# Patient Record
Sex: Male | Born: 1950 | Race: Black or African American | Hispanic: No | Smoking: Former smoker
Health system: Southern US, Community
[De-identification: ages and names within clinical notes are randomized; demographics above are authoritative.]

## PROBLEM LIST (undated history)

## (undated) DIAGNOSIS — E119 Type 2 diabetes mellitus without complications: Secondary | ICD-10-CM

## (undated) DIAGNOSIS — I10 Essential (primary) hypertension: Secondary | ICD-10-CM

## (undated) DIAGNOSIS — G4730 Sleep apnea, unspecified: Secondary | ICD-10-CM

## (undated) HISTORY — PX: HERNIA REPAIR: SHX51

## (undated) HISTORY — DX: Type 2 diabetes mellitus without complications: E11.9

---

## 2003-11-07 ENCOUNTER — Emergency Department: Payer: Self-pay | Admitting: Emergency Medicine

## 2005-08-04 ENCOUNTER — Emergency Department: Payer: Self-pay | Admitting: Emergency Medicine

## 2005-10-14 ENCOUNTER — Ambulatory Visit: Payer: Self-pay

## 2005-12-03 ENCOUNTER — Ambulatory Visit: Payer: Self-pay | Admitting: General Surgery

## 2007-02-15 ENCOUNTER — Encounter: Admission: RE | Admit: 2007-02-15 | Discharge: 2007-02-15 | Payer: Self-pay | Admitting: Family Medicine

## 2007-08-04 ENCOUNTER — Ambulatory Visit: Payer: Self-pay | Admitting: Gastroenterology

## 2007-12-12 ENCOUNTER — Emergency Department: Payer: Self-pay | Admitting: Emergency Medicine

## 2008-04-18 ENCOUNTER — Emergency Department: Payer: Self-pay | Admitting: Emergency Medicine

## 2010-02-11 ENCOUNTER — Encounter: Payer: Self-pay | Admitting: Family Medicine

## 2010-02-11 ENCOUNTER — Encounter: Payer: Self-pay | Admitting: Neurosurgery

## 2010-11-14 ENCOUNTER — Ambulatory Visit: Payer: Self-pay | Admitting: Gastroenterology

## 2011-08-06 ENCOUNTER — Emergency Department: Payer: Self-pay | Admitting: *Deleted

## 2011-08-06 LAB — CBC
HCT: 44.7 % (ref 40.0–52.0)
MCHC: 33.1 g/dL (ref 32.0–36.0)
Platelet: 166 10*3/uL (ref 150–440)
RDW: 13.8 % (ref 11.5–14.5)

## 2011-08-06 LAB — BASIC METABOLIC PANEL
Anion Gap: 9 (ref 7–16)
BUN: 23 mg/dL — ABNORMAL HIGH (ref 7–18)
Co2: 30 mmol/L (ref 21–32)
Creatinine: 1.88 mg/dL — ABNORMAL HIGH (ref 0.60–1.30)
EGFR (African American): 43 — ABNORMAL LOW
EGFR (Non-African Amer.): 37 — ABNORMAL LOW

## 2011-08-06 LAB — CK TOTAL AND CKMB (NOT AT ARMC): CK, Total: 568 U/L — ABNORMAL HIGH (ref 35–232)

## 2011-08-07 ENCOUNTER — Emergency Department: Payer: Self-pay | Admitting: Unknown Physician Specialty

## 2011-08-08 LAB — COMPREHENSIVE METABOLIC PANEL
Albumin: 3.9 g/dL (ref 3.4–5.0)
Alkaline Phosphatase: 80 U/L (ref 50–136)
Anion Gap: 8 (ref 7–16)
BUN: 29 mg/dL — ABNORMAL HIGH (ref 7–18)
Bilirubin,Total: 0.3 mg/dL (ref 0.2–1.0)
Co2: 33 mmol/L — ABNORMAL HIGH (ref 21–32)
Creatinine: 1.89 mg/dL — ABNORMAL HIGH (ref 0.60–1.30)
EGFR (African American): 43 — ABNORMAL LOW
EGFR (Non-African Amer.): 37 — ABNORMAL LOW
Glucose: 127 mg/dL — ABNORMAL HIGH (ref 65–99)
SGPT (ALT): 29 U/L
Sodium: 143 mmol/L (ref 136–145)
Total Protein: 8.1 g/dL (ref 6.4–8.2)

## 2011-08-08 LAB — CBC
MCHC: 33 g/dL (ref 32.0–36.0)
Platelet: 163 10*3/uL (ref 150–440)
RDW: 13.6 % (ref 11.5–14.5)
WBC: 5.2 10*3/uL (ref 3.8–10.6)

## 2011-08-25 ENCOUNTER — Emergency Department: Payer: Self-pay | Admitting: *Deleted

## 2011-08-25 LAB — BASIC METABOLIC PANEL
BUN: 16 mg/dL (ref 7–18)
EGFR (African American): >60
EGFR (Non-African Amer.): 55 — ABNORMAL LOW
Glucose: 106 mg/dL — ABNORMAL HIGH (ref 65–99)
Potassium: 3.8 mmol/L (ref 3.5–5.1)
Sodium: 140 mmol/L (ref 136–145)

## 2011-08-25 LAB — CBC
HCT: 38.2 % — ABNORMAL LOW (ref 40.0–52.0)
MCHC: 35.2 g/dL (ref 32.0–36.0)
Platelet: 151 10*3/uL (ref 150–440)
RDW: 13.5 % (ref 11.5–14.5)
WBC: 5.1 10*3/uL (ref 3.8–10.6)

## 2011-08-25 LAB — CK TOTAL AND CKMB (NOT AT ARMC)
CK, Total: 486 U/L — ABNORMAL HIGH (ref 35–232)
CK-MB: 2.8 ng/mL (ref 0.5–3.6)

## 2012-12-14 DIAGNOSIS — E785 Hyperlipidemia, unspecified: Secondary | ICD-10-CM | POA: Insufficient documentation

## 2013-06-19 ENCOUNTER — Emergency Department: Payer: Self-pay | Admitting: Emergency Medicine

## 2013-06-26 ENCOUNTER — Emergency Department (HOSPITAL_COMMUNITY)
Admission: EM | Admit: 2013-06-26 | Discharge: 2013-06-26 | Disposition: A | Discharge status: Home / Self Care | Payer: Medicare Other | Attending: Emergency Medicine | Admitting: Emergency Medicine

## 2013-06-26 ENCOUNTER — Emergency Department (HOSPITAL_COMMUNITY): Payer: Medicare Other

## 2013-06-26 ENCOUNTER — Encounter (HOSPITAL_COMMUNITY): Payer: Self-pay | Admitting: Emergency Medicine

## 2013-06-26 DIAGNOSIS — IMO0002 Reserved for concepts with insufficient information to code with codable children: Secondary | ICD-10-CM | POA: Insufficient documentation

## 2013-06-26 DIAGNOSIS — Z79899 Other long term (current) drug therapy: Secondary | ICD-10-CM | POA: Insufficient documentation

## 2013-06-26 DIAGNOSIS — S52009A Unspecified fracture of upper end of unspecified ulna, initial encounter for closed fracture: Secondary | ICD-10-CM

## 2013-06-26 DIAGNOSIS — I10 Essential (primary) hypertension: Secondary | ICD-10-CM | POA: Insufficient documentation

## 2013-06-26 DIAGNOSIS — Z5189 Encounter for other specified aftercare: Secondary | ICD-10-CM

## 2013-06-26 DIAGNOSIS — Y9289 Other specified places as the place of occurrence of the external cause: Secondary | ICD-10-CM | POA: Insufficient documentation

## 2013-06-26 DIAGNOSIS — R296 Repeated falls: Secondary | ICD-10-CM | POA: Insufficient documentation

## 2013-06-26 DIAGNOSIS — Y9302 Activity, running: Secondary | ICD-10-CM | POA: Insufficient documentation

## 2013-06-26 DIAGNOSIS — Z4802 Encounter for removal of sutures: Secondary | ICD-10-CM | POA: Insufficient documentation

## 2013-06-26 DIAGNOSIS — S52109A Unspecified fracture of upper end of unspecified radius, initial encounter for closed fracture: Principal | ICD-10-CM

## 2013-06-26 HISTORY — DX: Sleep apnea, unspecified: G47.30

## 2013-06-26 HISTORY — DX: Essential (primary) hypertension: I10

## 2013-06-26 MED ORDER — CEPHALEXIN 500 MG PO CAPS: ORAL_CAPSULE | ORAL | Status: DC

## 2013-06-26 MED ORDER — SULFAMETHOXAZOLE-TRIMETHOPRIM 800-160 MG PO TABS: 1.0000 | ORAL_TABLET | Freq: Two times a day (BID) | ORAL | Status: DC

## 2013-06-26 MED ORDER — OXYCODONE-ACETAMINOPHEN 5-325 MG PO TABS
1.0000 | ORAL_TABLET | Freq: Once | ORAL | Status: AC
  Administered 2013-06-26: 1 via ORAL
  Filled 2013-06-26: qty 1

## 2013-06-26 MED ORDER — OXYCODONE-ACETAMINOPHEN 5-325 MG PO TABS: 1.0000 | ORAL_TABLET | Freq: Four times a day (QID) | ORAL | Status: DC | PRN

## 2013-06-26 MED ORDER — OXYCODONE-ACETAMINOPHEN 5-325 MG PO TABS
2.0000 | ORAL_TABLET | Freq: Once | ORAL | Status: AC
  Administered 2013-06-26: 2 via ORAL
  Filled 2013-06-26: qty 2

## 2013-06-26 MED ORDER — PROMETHAZINE HCL 25 MG PO TABS: 25.0000 mg | ORAL_TABLET | Freq: Four times a day (QID) | ORAL | Status: DC | PRN

## 2013-06-26 NOTE — ED Provider Notes (Signed)
CSN: [id number]     Arrival date & time 06/26/13  1328 History   First MD Initiated Contact with Patient 06/26/13 1522     Chief Complaint  Patient presents with  . Fall     (Consider location/radiation/quality/duration/timing/severity/associated sxs/prior Treatment) HPI Comments: The patient is a right hand dominant 63 year old male with history of hypertension and sleep apnea who presents today after a mechanical fall. He reports that he was at [facility] and left his hat on the bumper cart. He was running to get it when he stepped in a hole. He fell on his outstretched left hand and then turned and fell onto his right elbow. He also hit his right knee. He is only having pain currently in his right elbow. It is worse with movement he palpation. During the fall he did not lose consciousness or hit his head. He is not on any blood thinners. He received one Percocet in triage but seems to have improved his pain. Patient also is concerned about an abscess that he had lanced one week ago. This was done at [facility]. He was supposed to have a wound check in 2 days, but did not return. He's been taking Keflex as prescribed. No fevers, chills, nausea, vomiting or abdominal pain. He does have pain with bowel movements. His last bowel movement was yesterday. TDAP up to date within 5 years.   The history is provided by the patient. No language interpreter was used.    Past Medical History  Diagnosis Date  . Hypertension   . Sleep apnea    Past Surgical History  Procedure Laterality Date  . Hernia repair     History reviewed. No pertinent family history. History  Substance Use Topics  . Smoking status: Never Smoker   . Smokeless tobacco: Not on file  . Alcohol Use: No    Review of Systems  Constitutional: Negative for fever and chills.  Respiratory: Negative for shortness of breath.   Cardiovascular: Negative for chest pain.  Gastrointestinal: Negative for  nausea, vomiting and abdominal pain.  Musculoskeletal: Positive for arthralgias, joint swelling and myalgias.  Skin: Positive for wound.  Neurological: Negative for headaches.  All other systems reviewed and are negative.     Allergies  Review of patient's allergies indicates no known allergies.  Home Medications   Prior to Admission medications   Medication Sig Start Date End Date Taking? Authorizing Provider  diltiazem (TIAZAC) 360 MG 24 hr capsule Take 360 mg by mouth daily. 06/24/13  Yes Historical Provider, MD  omeprazole (PRILOSEC) 20 MG capsule Take 20 mg by mouth daily. 06/21/13  Yes Historical Provider, MD  quinapril-hydrochlorothiazide (ACCURETIC) 20-12.5 MG per tablet Take 1 tablet by mouth daily. 06/10/13  Yes Historical Provider, MD  sulfamethoxazole-trimethoprim (BACTRIM DS) 800-160 MG per tablet Take 1 tablet by mouth 2 (two) times daily.  06/19/13  Yes Historical Provider, MD  tamsulosin (FLOMAX) 0.4 MG CAPS capsule Take 0.4 mg by mouth daily. 06/10/13  Yes Historical Provider, MD   BP 155/90  Pulse 67  Temp(Src) 98 F (36.7 C) (Oral)  Resp 20  Ht 5\' 7"  (1.702 m)  Wt 220 lb (99.791 kg)  BMI 34.45 kg/m2  SpO2 100% Physical Exam  Nursing note and vitals reviewed. Constitutional: He is oriented to person, place, and time. He appears well-developed and well-nourished. No distress.  HENT:  Head: Normocephalic and atraumatic.  Right Ear: External ear normal.  Left Ear: External ear normal.  Nose: Nose normal.  Eyes: Conjunctivae and EOM are normal. Pupils are equal, round, and reactive to light.  Neck: Normal range of motion. No spinous process tenderness and no muscular tenderness present. No tracheal deviation present.  Cardiovascular: Normal rate, regular rhythm, normal heart sounds, intact distal pulses and normal pulses.   Pulses:      Radial pulses are 2+ on the right side, and 2+ on the left side.       Posterior tibial pulses are 2+ on the right side, and 2+ on  the left side.  Pulmonary/Chest: Effort normal and breath sounds normal. No stridor.  Abdominal: Soft. He exhibits no distension. There is no tenderness.  Genitourinary:  Well healing area on right buttock. No induration or fluctuance. No erythema. No rectal involvement.   Musculoskeletal: Normal range of motion.  Tenderness to palpation over right elbow. ROM decreased. Joint effusion to right elbow. Compartment soft.   Neurological: He is alert and oriented to person, place, and time.  Sensation intact over entire right arm.   Skin: Skin is warm and dry. Abrasion noted. He is not diaphoretic.  Patient with abrasion to right knee.   Psychiatric: He has a normal mood and affect. His behavior is normal.    ED Course  Procedures (including critical care time) Labs Review Labs Reviewed - No data to display  Imaging Review Dg Elbow Complete Right  06/26/2013   CLINICAL DATA:  Pain post trauma  EXAM: RIGHT ELBOW - COMPLETE 3+ VIEW  COMPARISON:  None.  FINDINGS: Frontal, lateral, and bilateral oblique views were obtained. There is a fracture of the proximal radial metaphysis in near anatomic alignment. There is also an avulsion arising from the coronoid process of the proximal ulna. There is no dislocation. There is a joint effusion. There is no appreciable joint space narrowing.  IMPRESSION: Fracture proximal radial metaphysis. Comminuted avulsion type fracture along the choroid process of the proximal ulna. Joint effusion present. No dislocation.   Electronically Signed   By: Bretta Bang M.D.   On: 06/26/2013 15:29   Dg Forearm Right  06/26/2013   CLINICAL DATA:  Fall, right elbow pain  EXAM: RIGHT FOREARM - 2 VIEW  COMPARISON:  Concurrently obtained radiographs of the right humerus, elbow and hand  FINDINGS: Acute fracture through the neck of the radius. Additionally, there appears to be an acute fracture of the coronoid process of the ulna. There is associated soft tissue swelling and an  elbow joint effusion. The proximal and distal radioulnar joints remain congruent. The visualized portion of the wrists is unremarkable.  IMPRESSION: 1. Acute fracture through the neck of the radius. 2. Probable acute fracture through the coronoid process of the ulna. 3. Elbow joint hemarthrosis.   Electronically Signed   By: Malachy Moan M.D.   On: 06/26/2013 15:31   Dg Humerus Right  06/26/2013   CLINICAL DATA:  Pain post trauma  EXAM: RIGHT HUMERUS - 2+ VIEW  COMPARISON:  None.  FINDINGS: Frontal and lateral views were obtained. There is a fracture of the proximal radial metaphysis. There is also an avulsion arising from the proximal ulna. The humerus appears intact without fracture or dislocation. Joint spaces appear grossly intact.  IMPRESSION: Fractures of the proximal radius and ulna. No humerus fracture. No dislocation seen.   Electronically Signed   By: Bretta Bang M.D.   On: 06/26/2013 15:27   Dg Hand Complete Right  06/26/2013   CLINICAL DATA:  Fall.  Right hand injury and pain.  EXAM:  RIGHT HAND - COMPLETE 3+ VIEW  COMPARISON:  None.  FINDINGS: There is no evidence of fracture or dislocation. 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 No other focal bone abnormality. Soft tissues are unremarkable.  IMPRESSION: No acute findings.   Electronically Signed   By: Myles RosenthalJohn  Stahl M.D.   On: 06/26/2013 15:31     EKG Interpretation None       EMERGENCY DEPARTMENT US SOFT TISSUE INTERPRETATION "Study: Limited Ultrasound of the noted body part in comments below"  INDICATIONS: Soft tissue infection Multiple views of the body part are obtained with a multi-frequency linear probe  PERFORMED BY:  Myself  IMAGES ARCHIVED?: No  SIDE:Right   BODY PART:Other soft tisse (comment in note)  FINDINGS: No abcess noted  LIMITATIONS:  Body Habitus  INTERPRETATION:  No abcess noted and No cellulitis noted  COMMENT:  Right  buttock     MDM   Final diagnoses:  Radius and ulna proximal end fracture  Wound check, abscess   Patient presents to ED for evaluation of right arm pain after a fall. Patient with proximal radial and ulnar fracture. Discussed case with Dr. Janee Mornhompson of hand surgery who recommends posterior splint and for the patient to call his office on Monday to be seen either Monday or Tuesday. Patient is neurovascularly intact and compartment is soft. Patient also with wound recheck. Abscess on right buttocks just posterior to testicle appears to be healing well. A bedside ultrasound was done which shows there is no collection of fluid. There is no surrounding erythema or signs of infection. Patient will continue to take his abx as prescribed. Discussed reasons to return to ED. Vital signs stable for discharge. Patient / Family / Caregiver informed of clinical course, understand medical decision-making process, and agree with plan.    Mora BellmanHannah S Kaoru Rezendes, PA-C 06/26/13 1931

## 2013-06-26 NOTE — Progress Notes (Signed)
Orthopedic Tech Progress Note Patient Details:  Troy Cooper Apr 13, 1950 [id number]  Ortho Devices Type of Ortho Device: Ace wrap;Long arm splint Ortho Device/Splint Location: rue Ortho Device/Splint Interventions: Application   Tinnie Kunin 06/26/2013, 4:45 PM

## 2013-06-26 NOTE — ED Provider Notes (Signed)
Medical screening examination/treatment/procedure(s) were performed by non-physician practitioner and as supervising physician I was immediately available for consultation/collaboration.   EKG Interpretation None       Ethelda Chick, MD 06/26/13 [phone]

## 2013-06-26 NOTE — ED Notes (Signed)
Ortho paged for splint, enroute.

## 2013-06-26 NOTE — ED Notes (Signed)
Per pt sts he had a fall earlier and fell on right arm. sts hurts right upper arm and elbow area. sts he can barely move it.

## 2013-06-26 NOTE — Discharge Instructions (Signed)
Radial Fracture You have a broken bone (fracture) of the forearm. This is the part of your arm between the elbow and your wrist. Your forearm is made up of two bones. These are the radius and ulna. Your fracture is in the radial shaft. This is the bone in your forearm located on the thumb side. A cast or splint is used to protect and keep your injured bone from moving. The cast or splint will be on generally for about 5 to 6 weeks, with individual variations. HOME CARE INSTRUCTIONS   Keep the injured part elevated while sitting or lying down. Keep the injury above the level of your heart (the center of the chest). This will decrease swelling and pain.  Apply ice to the injury for 15-20 minutes, 03-04 times per day while awake, for 2 days. Put the ice in a plastic bag and place a towel between the bag of ice and your cast or splint.  Move your fingers to avoid stiffness and minimize swelling.  If you have a plaster or fiberglass cast:  Do not try to scratch the skin under the cast using sharp or pointed objects.  Check the skin around the cast every day. You may put lotion on any red or sore areas.  Keep your cast dry and clean.  If you have a plaster splint:  Wear the splint as directed.  You may loosen the elastic around the splint if your fingers become numb, tingle, or turn cold or blue.  Do not put pressure on any part of your cast or splint. It may break. Rest your cast only on a pillow for the first 24 hours until it is fully hardened.  Your cast or splint can be protected during bathing with a plastic bag. Do not lower the cast or splint into water.  Only take over-the-counter or prescription medicines for pain, discomfort, or fever as directed by your caregiver. SEEK IMMEDIATE MEDICAL CARE IF:   Your cast gets damaged or breaks.  You have more severe pain or swelling than you did before getting the cast.  You have severe pain when stretching your fingers.  There is a bad  smell, new stains and/or pus-like (purulent) drainage coming from under the cast.  Your fingers or hand turn pale or blue and become cold or your loose feeling. Document Released: 06/20/2005 Document Revised: 04/01/2011 Document Reviewed: 09/16/2005 [facility] Patient Information 2014 [place], [place].  Ulnar Fracture You have a fracture (broken bone) of the forearm. This is the part of your arm between the elbow and your wrist. Your forearm is made up of two bones. These are the radius and ulna. Your fracture is in the ulna. This is the bone in your forearm located on the little finger side of your forearm. A cast or splint is used to protect and keep your injured bone from moving. The cast or splint will be on generally for about 5 to 6 weeks, with individual variations. HOME CARE INSTRUCTIONS   Keep the injured part elevated while sitting or lying down. Keep the injury above the level of your heart (the center of the chest). This will decrease swelling and pain.  Apply ice to the injury for 15-20 minutes, 03-04 times per day while awake, for 2 days. Put the ice in a plastic bag and place a towel between the bag of ice and your cast or splint.  Move your fingers to avoid stiffness and minimize swelling.  If you have a plaster or  fiberglass cast:  Do not try to scratch the skin under the cast using sharp or pointed objects.  Check the skin around the cast every day. You may put lotion on any red or sore areas.  Keep your cast dry and clean.  If you have a plaster splint:  Wear the splint as directed.  You may loosen the elastic around the splint if your fingers become numb, tingle, or turn cold or blue.  Do not put pressure on any part of your cast or splint. It may break. Rest your cast only on a pillow the first 24 hours until it is fully hardened.  Your cast or splint can be protected during bathing with a plastic bag. Do not lower the cast or splint into water.  Only take  over-the-counter or prescription medicines for pain, discomfort, or fever as directed by your caregiver. SEEK IMMEDIATE MEDICAL CARE IF:   Your cast gets damaged or breaks.  You have more severe pain or swelling than you did before the cast.  You have severe pain when stretching your fingers.  There is a bad smell or new stains and/or purulent (pus like) drainage coming from under the cast. Document Released: 06/20/2005 Document Revised: 04/01/2011 Document Reviewed: 11/22/2006 [facility] Patient Information 2014 [place], [place].

## 2013-08-08 ENCOUNTER — Encounter (HOSPITAL_COMMUNITY): Payer: Self-pay | Admitting: Emergency Medicine

## 2013-08-08 ENCOUNTER — Emergency Department (HOSPITAL_COMMUNITY)
Admission: EM | Admit: 2013-08-08 | Discharge: 2013-08-08 | Disposition: A | Discharge status: Home / Self Care | Payer: Medicare Other | Attending: Emergency Medicine | Admitting: Emergency Medicine

## 2013-08-08 DIAGNOSIS — I10 Essential (primary) hypertension: Secondary | ICD-10-CM | POA: Diagnosis not present

## 2013-08-08 DIAGNOSIS — M436 Torticollis: Secondary | ICD-10-CM | POA: Diagnosis not present

## 2013-08-08 DIAGNOSIS — R51 Headache: Secondary | ICD-10-CM | POA: Diagnosis present

## 2013-08-08 DIAGNOSIS — M62838 Other muscle spasm: Secondary | ICD-10-CM

## 2013-08-08 DIAGNOSIS — Z79899 Other long term (current) drug therapy: Secondary | ICD-10-CM | POA: Insufficient documentation

## 2013-08-08 MED ORDER — DIAZEPAM 5 MG PO TABS: 5.0000 mg | ORAL_TABLET | Freq: Two times a day (BID) | ORAL | Status: DC

## 2013-08-08 MED ORDER — TRAMADOL HCL 50 MG PO TABS: 50.0000 mg | ORAL_TABLET | Freq: Four times a day (QID) | ORAL | Status: DC | PRN

## 2013-08-08 MED ORDER — DIAZEPAM 5 MG PO TABS
5.0000 mg | ORAL_TABLET | Freq: Once | ORAL | Status: AC
  Administered 2013-08-08: 5 mg via ORAL
  Filled 2013-08-08: qty 1

## 2013-08-08 MED ORDER — HYDROCODONE-ACETAMINOPHEN 5-325 MG PO TABS
1.0000 | ORAL_TABLET | Freq: Once | ORAL | Status: AC
  Administered 2013-08-08: 1 via ORAL
  Filled 2013-08-08: qty 1

## 2013-08-08 NOTE — ED Provider Notes (Signed)
CSN: [id number]     Arrival date & time 08/08/13  1803 History  This chart was scribed for Johnnette Gourd, PA, working with Layla Maw Ward, DO found by Elon Spanner, ED Scribe. This patient was seen in room TR06C/TR06C and the patient's care was started at 7:10 PM.    Chief Complaint  Patient presents with  . Headache    The history is provided by the patient. No language interpreter was used.    HPI Comments: Troy Cooper is a 63 y.o. male who presents to the Emergency Department complaining of constant, unchanged left-sided neck pain radiating up to his left ear that has been ongoing for 4 weeks.  The neck pain is currently rated a 10/10 and aggravated by coughing, chewing, and swallowing.  He states that all these actions cause the pain to radiate.  He reports that he has been having difficulty sleeping due to the severity of the pain. The patient also notes he has been experiencing some spasms in his neck.  He reports breaking his right elbow after a mechanical fall on June 6 and has been in an arm brace until recently.  He cannot say definitively that he did not sustain any head trauma during the fall but did not experience head pain immediately post-incident.  His head is currently sore to touch and he reports no relief from the naproxen or Robaxin that he has taken for his pain.  However, he states that his pain was relieved by 2 Percocet that he was given at the time of his elbow injury. He denies tinnitus, changes in hearing, vision changes, or headaches.  He also denies shoulder pain, difficulty moving arms. Denies chest pain or sob.   Past Medical History  Diagnosis Date  . Hypertension   . Sleep apnea    Past Surgical History  Procedure Laterality Date  . Hernia repair     History reviewed. No pertinent family history. History  Substance Use Topics  . Smoking status: Never Smoker   . Smokeless tobacco: Not on file  . Alcohol Use: No    Review of Systems  HENT: Negative  for hearing loss and tinnitus.   Eyes: Negative for visual disturbance.  Musculoskeletal: Positive for arthralgias (Right elbow) and neck pain.  Neurological: Positive for headaches.  All other systems reviewed and are negative.     Allergies  Review of patient's allergies indicates no known allergies.  Home Medications   Prior to Admission medications   Medication Sig Start Date End Date Taking? Authorizing Provider  diltiazem (TIAZAC) 360 MG 24 hr capsule Take 360 mg by mouth daily. 06/24/13  Yes Historical Provider, MD  methocarbamol (ROBAXIN) 750 MG tablet Take 750 mg by mouth 4 (four) times daily.   Yes Historical Provider, MD  naproxen sodium (ANAPROX) 550 MG tablet Take 550 mg by mouth 2 (two) times daily as needed. For pain 08/03/13  Yes Historical Provider, MD  omeprazole (PRILOSEC) 20 MG capsule Take 20 mg by mouth daily. 06/21/13  Yes Historical Provider, MD  quinapril-hydrochlorothiazide (ACCURETIC) 20-12.5 MG per tablet Take 1 tablet by mouth daily. 06/10/13  Yes Historical Provider, MD  tamsulosin (FLOMAX) 0.4 MG CAPS capsule Take 0.4 mg by mouth daily. 06/10/13  Yes Historical Provider, MD  diazepam (VALIUM) 5 MG tablet Take 1 tablet (5 mg total) by mouth 2 (two) times daily. 08/08/13   Trevor Mace, PA-C  traMADol (ULTRAM) 50 MG tablet Take 1 tablet (50 mg total) by mouth every  6 (six) hours as needed. 08/08/13   Trevor Maceobyn M Albert, PA-C   BP 144/81  Pulse 76  Temp(Src) 98.4 F (36.9 C) (Oral)  Resp 16  Ht 5\' 7"  (1.702 m)  Wt 220 lb (99.791 kg)  BMI 34.45 kg/m2  SpO2 98% Physical Exam  Nursing note and vitals reviewed. Constitutional: He is oriented to person, place, and time. He appears well-developed and well-nourished. No distress.  HENT:  Head: Normocephalic and atraumatic.  Right Ear: Tympanic membrane normal.  Left Ear: Tympanic membrane normal.  .   Eyes: Conjunctivae and EOM are normal. Pupils are equal, round, and reactive to light.  Neck: Normal range of  motion. Neck supple.  Tender to palpation over left cervical paraspinal muscles.  Worse at base of occiput with spasm.  FROM, pain with flexion.   Cardiovascular: Normal rate, regular rhythm and normal heart sounds.   Pulmonary/Chest: Effort normal and breath sounds normal. No respiratory distress. He has no wheezes. He has no rales.  Musculoskeletal: Normal range of motion. He exhibits no edema.  Neurological: He is alert and oriented to person, place, and time.  5/5 strength in upper extremities.  Sensation intact.     Skin: Skin is warm and dry.  No noticeable bruising or laceration.   Psychiatric: He has a normal mood and affect. His behavior is normal.    ED Course  Procedures (including critical care time)  DIAGNOSTIC STUDIES: Oxygen Saturation is 98% on RA, normal by my interpretation.    COORDINATION OF CARE:  7:18 PM Discussed plans to order pain medication and prescribe tramadol and Valium.  Patient acknowledges and agrees with plan.    Labs Review Labs Reviewed - No data to display  Imaging Review No results found.   EKG Interpretation None      MDM   Final diagnoses:  Torticollis  Muscle spasms of neck   Patient well-appearing in no apparent distress. Afebrile, vital signs. No red flags concerning patient's back pain. Afebrile, no midline tenderness, no focal neurologic deficits. Tenderness and spasm as stated in physical exam. No associated chest pain or shortness of breath. Will change muscle relaxer to Valium from Robaxin and prescribe short course of pain medication. Advised him to apply a heating pad. Followup with PCP. Stable for discharge. Return precautions given. Patient states understanding of treatment care plan and is agreeable.    I personally performed the services described in this documentation, which was scribed in my presence. The recorded information has been reviewed and is accurate.    Trevor MaceRobyn M Albert, PA-C 08/08/13 2344

## 2013-08-08 NOTE — ED Provider Notes (Signed)
Medical screening examination/treatment/procedure(s) were performed by non-physician practitioner and as supervising physician I was immediately available for consultation/collaboration.   EKG Interpretation None        Kristen N Ward, DO 08/08/13 2345 

## 2013-08-08 NOTE — ED Notes (Signed)
Discharge instructions reviewed with pt. Pt verbalized understanding.   

## 2013-08-08 NOTE — Discharge Instructions (Signed)
Take Valium as needed as directed for muscle spasm. No driving or operating heavy machinery while taking valium. This medication may cause drowsiness. Take tramadol as directed as needed for pain. Apply a heating pad. Follow up with your primary care doctor.   Muscle Cramps and Spasms Muscle cramps and spasms occur when a muscle or muscles tighten and you have no control over this tightening (involuntary muscle contraction). They are a common problem and can develop in any muscle. The most common place is in the calf muscles of the leg. Both muscle cramps and muscle spasms are involuntary muscle contractions, but they also have differences:   Muscle cramps are sporadic and painful. They may last a few seconds to a quarter of an hour. Muscle cramps are often more forceful and last longer than muscle spasms.  Muscle spasms may or may not be painful. They may also last just a few seconds or much longer. CAUSES  It is uncommon for cramps or spasms to be due to a serious underlying problem. In many cases, the cause of cramps or spasms is unknown. Some common causes are:   Overexertion.   Overuse from repetitive motions (doing the same thing over and over).   Remaining in a certain position for a long period of time.   Improper preparation, form, or technique while performing a sport or activity.   Dehydration.   Injury.   Side effects of some medicines.   Abnormally low levels of the salts and ions in your blood (electrolytes), especially potassium and calcium. This could happen if you are taking water pills (diuretics) or you are pregnant.  Some underlying medical problems can make it more likely to develop cramps or spasms. These include, but are not limited to:   Diabetes.   Parkinson disease.   Hormone disorders, such as thyroid problems.   Alcohol abuse.   Diseases specific to muscles, joints, and bones.   Blood vessel disease where not enough blood is getting to  the muscles.  HOME CARE INSTRUCTIONS   Stay well hydrated. Drink enough water and fluids to keep your urine clear or pale yellow.  It may be helpful to massage, stretch, and relax the affected muscle.  For tight or tense muscles, use a warm towel, heating pad, or hot shower water directed to the affected area.  If you are sore or have pain after a cramp or spasm, applying ice to the affected area may relieve discomfort.  Put ice in a plastic bag.  Place a towel between your skin and the bag.  Leave the ice on for 15-20 minutes, 03-04 times a day.  Medicines used to treat a known cause of cramps or spasms may help reduce their frequency or severity. Only take over-the-counter or prescription medicines as directed by your caregiver. SEEK MEDICAL CARE IF:  Your cramps or spasms get more severe, more frequent, or do not improve over time.  MAKE SURE YOU:   Understand these instructions.  Will watch your condition.  Will get help right away if you are not doing well or get worse. Document Released: 06/29/2001 Document Revised: 05/04/2012 Document Reviewed: 12/25/2011 [facility] Patient Information 2015 [place], [place]. This information is not intended to replace advice given to you by your health care provider. Make sure you discuss any questions you have with your health care provider.  Spasticity Spasticity is a condition in which certain muscles contract continuously. This causes stiffness or tightness of the muscles. It may interfere with movement, speech,  and manner of walking. CAUSES  This condition is usually caused by damage to the portion of the brain or spinal cord that controls voluntary movement. It may occur in association with:  Spinal cord injury.  Multiple sclerosis.  Cerebral palsy.  Brain damage due to lack of oxygen.  Brain trauma.  Severe head injury.  Metabolic diseases such as:  Adrenoleukodystrophy.  ALS Truddie [place] Gehrig's  disease).  Phenylketonuria. SYMPTOMS   Increased muscle tone (hypertonicity).  A series of rapid muscle contractions (clonus).  Exaggerated deep tendon reflexes.  Muscle spasms.  Involuntary crossing of the legs (scissoring).  Fixed joints. The degree of spasticity varies. It ranges from mild muscle stiffness to severe, painful, and uncontrollable muscle spasms. It can interfere with rehabilitation in patients with certain disorders. It often interferes with daily activities. TREATMENT  Treatment may include:  Medications.  Physical therapy regimens. They may include muscle stretching and range of motion exercises. These help prevent shrinkage or shortening of muscles. They also help reduce the severity of symptoms.  Surgery. This may be recommended for tendon release or to sever the nerve-muscle pathway. PROGNOSIS  The outcome for those with spasticity depends on:  Severity of the spasticity.  Associated disorder(s). Document Released: 12/28/2001 Document Revised: 04/01/2011 Document Reviewed: 01/07/2005 [facility] Patient Information 2015 [place], [place]. This information is not intended to replace advice given to you by your health care provider. Make sure you discuss any questions you have with your health care provider.  Torticollis, Acute You have suddenly (acutely) developed a twisted neck (torticollis). This is usually a self-limited condition. CAUSES  Acute torticollis may be caused by malposition, trauma or infection. Most commonly, acute torticollis is caused by sleeping in an awkward position. Torticollis may also be caused by the flexion, extension or twisting of the neck muscles beyond their normal position. Sometimes, the exact cause may not be known. SYMPTOMS  Usually, there is pain and limited movement of the neck. Your neck may twist to one side. DIAGNOSIS  The diagnosis is often made by physical examination. X-rays, CT scans or MRIs may be done if there is a  history of trauma or concern of infection. TREATMENT  For a common, stiff neck that develops during sleep, treatment is focused on relaxing the contracted neck muscle. Medications (including shots) may be used to treat the problem. Most cases resolve in several days. Torticollis usually responds to conservative physical therapy. If left untreated, the shortened and spastic neck muscle can cause deformities in the face and neck. Rarely, surgery is required. HOME CARE INSTRUCTIONS   Use over-the-counter and prescription medications as directed by your caregiver.  Do stretching exercises and massage the neck as directed by your caregiver.  Follow up with physical therapy if needed and as directed by your caregiver. SEEK IMMEDIATE MEDICAL CARE IF:   You develop difficulty breathing or noisy breathing (stridor).  You drool, develop trouble swallowing or have pain with swallowing.  You develop numbness or weakness in the hands or feet.  You have changes in speech or vision.  You have problems with urination or bowel movements.  You have difficulty walking.  You have a fever.  You have increased pain. MAKE SURE YOU:   Understand these instructions.  Will watch your condition.  Will get help right away if you are not doing well or get worse. Document Released: 01/05/2000 Document Revised: 04/01/2011 Document Reviewed: 02/15/2009 [facility] Patient Information 2015 [place], [place]. This information is not intended to replace advice given to you  by your health care provider. Make sure you discuss any questions you have with your health care provider. ° °

## 2013-08-08 NOTE — ED Notes (Signed)
He states hes had pain in his L ear and head and hurts to swallow and chew x 2 weeks.

## 2013-09-02 DIAGNOSIS — A4902 Methicillin resistant Staphylococcus aureus infection, unspecified site: Secondary | ICD-10-CM | POA: Insufficient documentation

## 2013-10-28 ENCOUNTER — Emergency Department: Payer: Self-pay | Admitting: Emergency Medicine

## 2013-10-28 LAB — CBC
HCT: 38.9 % — ABNORMAL LOW (ref 40.0–52.0)
HGB: 13 g/dL (ref 13.0–18.0)
MCH: 29.5 pg (ref 26.0–34.0)
MCHC: 33.4 g/dL (ref 32.0–36.0)
MCV: 89 fL (ref 80–100)
PLATELETS: 176 10*3/uL (ref 150–440)
RBC: 4.39 10*6/uL — ABNORMAL LOW (ref 4.40–5.90)
RDW: 14.6 % — AB (ref 11.5–14.5)
WBC: 5.5 10*3/uL (ref 3.8–10.6)

## 2013-10-28 LAB — BASIC METABOLIC PANEL
ANION GAP: 3 — AB (ref 7–16)
BUN: 19 mg/dL — AB (ref 7–18)
CO2: 32 mmol/L (ref 21–32)
Calcium, Total: 8.7 mg/dL (ref 8.5–10.1)
Chloride: 104 mmol/L (ref 98–107)
Creatinine: 1.42 mg/dL — ABNORMAL HIGH (ref 0.60–1.30)
EGFR (African American): >60
EGFR (Non-African Amer.): 53 — ABNORMAL LOW
GLUCOSE: 104 mg/dL — AB (ref 65–99)
Osmolality: 280 (ref 275–301)
Potassium: 3.6 mmol/L (ref 3.5–5.1)
Sodium: 139 mmol/L (ref 136–145)

## 2013-10-28 LAB — TROPONIN I: Troponin-I: <0.02 ng/mL

## 2014-02-01 ENCOUNTER — Emergency Department: Payer: Self-pay | Admitting: Emergency Medicine

## 2014-03-08 DIAGNOSIS — K4020 Bilateral inguinal hernia, without obstruction or gangrene, not specified as recurrent: Secondary | ICD-10-CM | POA: Insufficient documentation

## 2015-09-01 IMAGING — CR DG ELBOW COMPLETE 3+V*R*
4 series · 4 of 4 positions shown · non-contrast
Comparison: None.

CLINICAL DATA: Pain post trauma

EXAM:
RIGHT ELBOW - COMPLETE 3+ VIEW

[x elbow ap right]
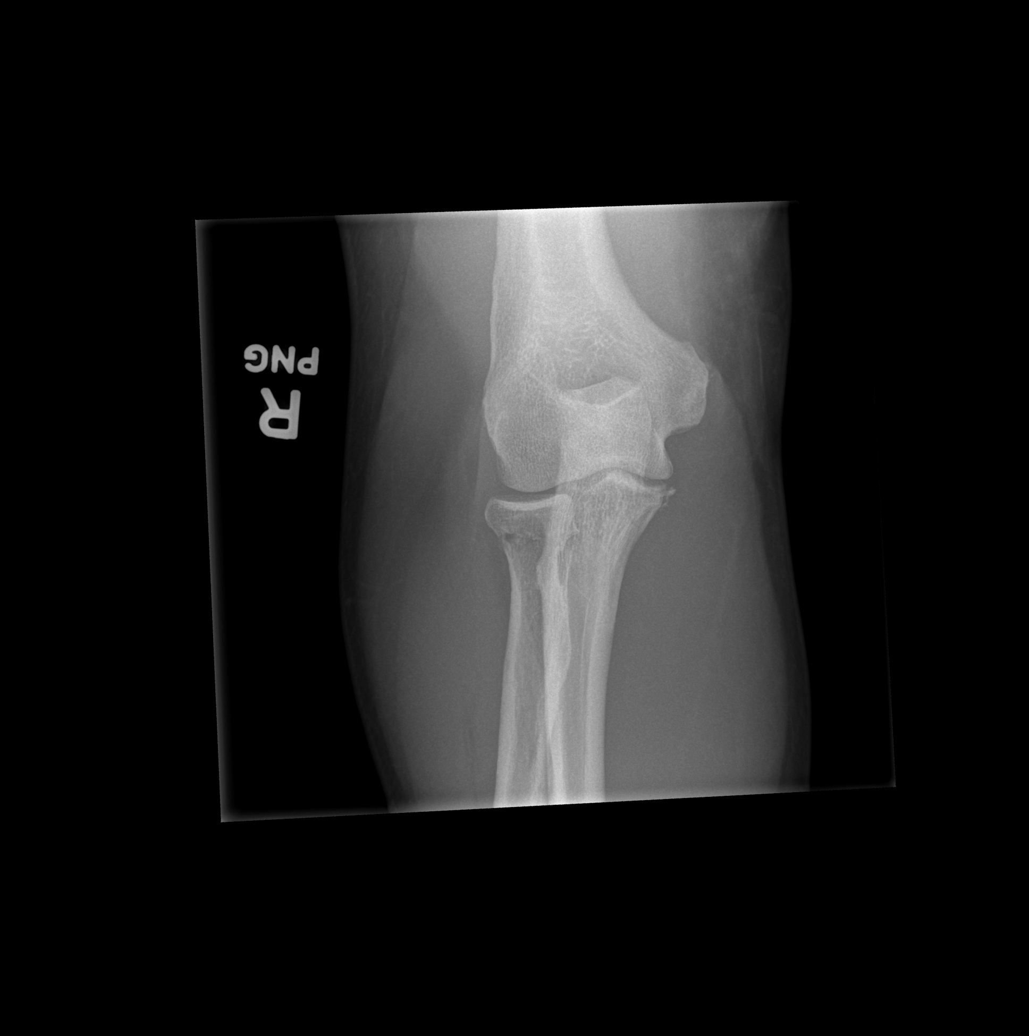

[x elbow obl right (1 of 2)]
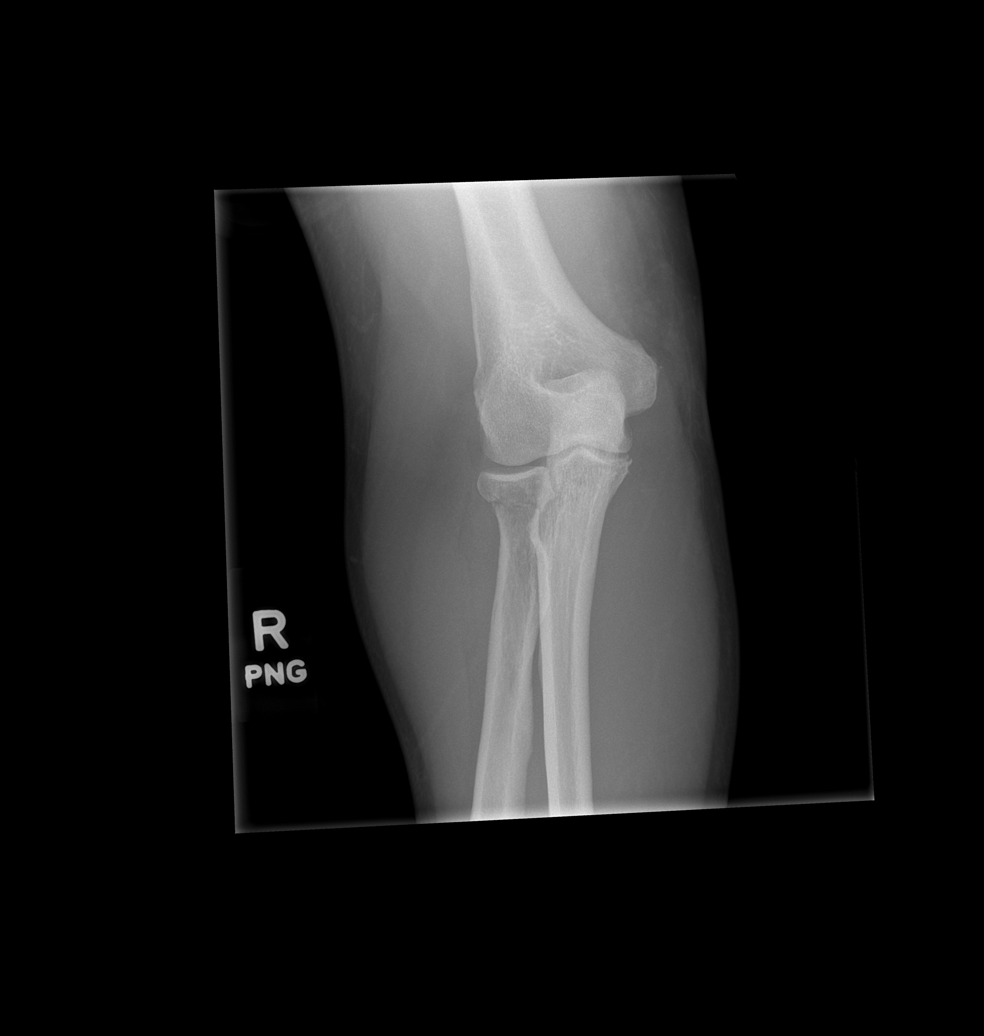

[x elbow obl right (2 of 2)]
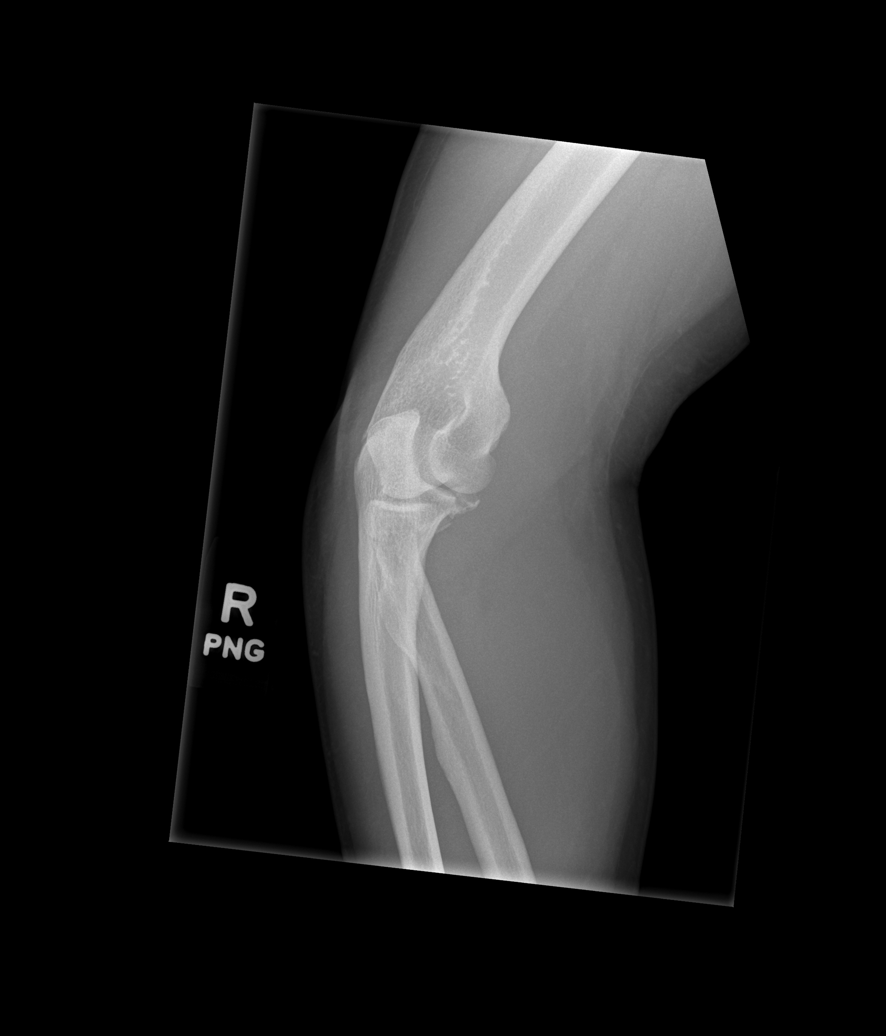

[x elbow lat right]
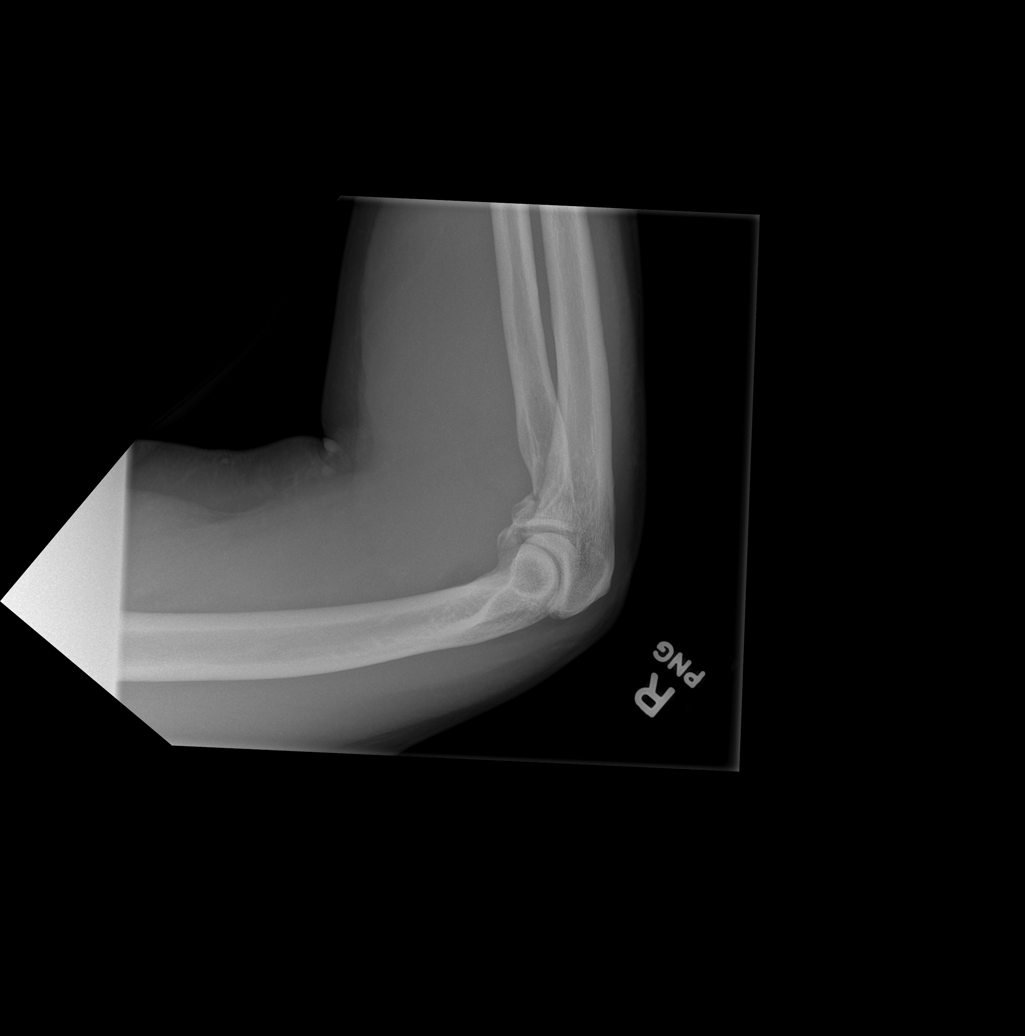

[4 of 4 positions shown; findings below may reference images not displayed]

FINDINGS: Frontal, lateral, and bilateral oblique views were obtained. There
is a fracture of the proximal radial metaphysis in near anatomic
alignment. There is also an avulsion arising from the coronoid
process of the proximal ulna. There is no dislocation. There is a
joint effusion. There is no appreciable joint space narrowing.
IMPRESSION: Fracture proximal radial metaphysis. Comminuted avulsion type
fracture along the choroid process of the proximal ulna. Joint
effusion present. No dislocation.

## 2015-10-11 ENCOUNTER — Telehealth: Payer: Self-pay | Admitting: Gastroenterology

## 2015-10-11 NOTE — Telephone Encounter (Signed)
colonoscopy

## 2015-10-13 ENCOUNTER — Telehealth: Payer: Self-pay

## 2015-10-13 ENCOUNTER — Other Ambulatory Visit: Payer: Self-pay

## 2015-10-13 DIAGNOSIS — E119 Type 2 diabetes mellitus without complications: Secondary | ICD-10-CM | POA: Insufficient documentation

## 2015-10-13 NOTE — Telephone Encounter (Signed)
Patient is expecting an important phone call. He will call me back later on today when he is ready to schedule

## 2015-10-13 NOTE — Telephone Encounter (Signed)
Gastroenterology Pre-Procedure Review  Request Date: 11/13/2015 Requesting Physician: Dr. Gavin PottersGrandis  PATIENT REVIEW QUESTIONS: The patient responded to the following health history questions as indicated:    1. Are you having any GI issues? no 2. Do you have a personal history of Polyps? yes (benign) 3. Do you have a family history of Colon Cancer or Polyps? no 4. Diabetes Mellitus? no 5. Joint replacements in the past 12 months?no 6. Major health problems in the past 3 months?no 7. Any artificial heart valves, MVP, or defibrillator?no    MEDICATIONS & ALLERGIES:    Patient reports the following regarding taking any anticoagulation/antiplatelet therapy:   Plavix, Coumadin, Eliquis, Xarelto, Lovenox, Pradaxa, Brilinta, or Effient? no Aspirin? yes (Heart Health)  Patient confirms/reports the following medications:  Current Outpatient Prescriptions  Medication Sig Dispense Refill  . aspirin EC 81 MG tablet Take 81 mg by mouth daily.    Marland Kitchen. diltiazem (TIAZAC) 360 MG 24 hr capsule Take 360 mg by mouth daily.    Marland Kitchen. omeprazole (PRILOSEC) 20 MG capsule Take 20 mg by mouth daily.    . quinapril-hydrochlorothiazide (ACCURETIC) 20-12.5 MG per tablet Take 1 tablet by mouth daily.    . tamsulosin (FLOMAX) 0.4 MG CAPS capsule Take 0.4 mg by mouth daily.    Marland Kitchen. diltiazem (CARDIZEM LA) 360 MG 24 hr tablet Take by mouth.    . traMADol (ULTRAM) 50 MG tablet Take 1 tablet (50 mg total) by mouth every 6 (six) hours as needed. 15 tablet 0   No current facility-administered medications for this visit.     Patient confirms/reports the following allergies:  Allergies  Allergen Reactions  . Other Other (See Comments)    Allergic to Statins, declines  . Iodinated Diagnostic Agents Nausea Only    No orders of the defined types were placed in this encounter.   AUTHORIZATION INFORMATION Primary Insurance: 1D#: Group #:  Secondary Insurance: 1D#: Group #:  SCHEDULE INFORMATION: Date:  11/13/2015 Time: Location: [facility]

## 2015-10-13 NOTE — Telephone Encounter (Signed)
[facility] Medicaid/ medicare  Pre cert is not required

## 2015-11-13 ENCOUNTER — Ambulatory Visit: Admit: 2015-11-13 | Payer: No Typology Code available for payment source | Admitting: Gastroenterology

## 2015-11-13 SURGERY — COLONOSCOPY WITH PROPOFOL
Anesthesia: General

## 2016-01-03 IMAGING — CR DG CHEST 1V PORT
1 series · 1 of 1 positions shown · non-contrast
Comparison: Two-view chest 08/07/2011.

CLINICAL DATA: Central and left-sided chest pain. Hypertension.
Initial encounter.

EXAM:
PORTABLE CHEST - 1 VIEW

[ap]
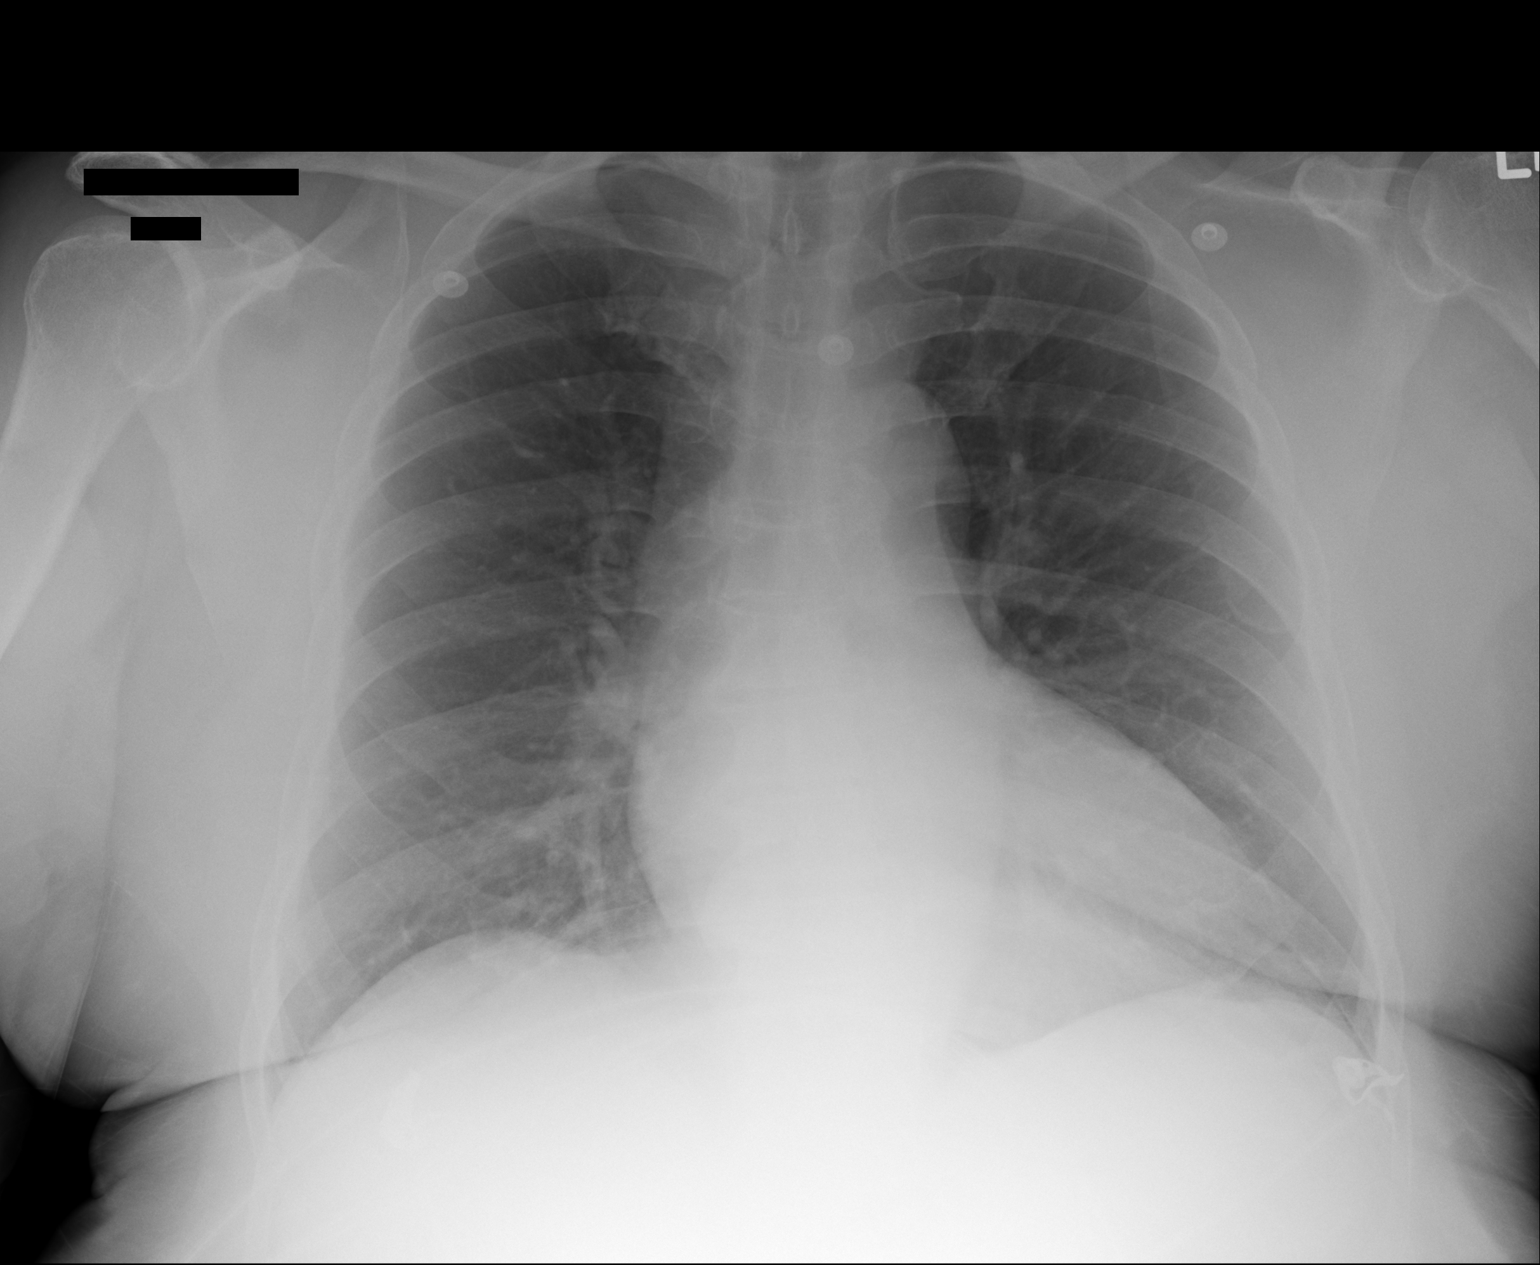

[1 of 1 positions shown; findings below may reference images not displayed]

FINDINGS: The heart is mildly enlarged. This may be exaggerated by technique.
There is no edema or effusion to suggest failure. No focal airspace
disease is present. The visualized soft tissues and bony thorax are
unremarkable.
IMPRESSION: 1. Borderline cardiomegaly without failure.
2. Otherwise negative one-view chest.

## 2017-01-10 ENCOUNTER — Ambulatory Visit: Payer: No Typology Code available for payment source | Admitting: *Deleted

## 2017-10-06 ENCOUNTER — Telehealth: Payer: Self-pay | Admitting: Internal Medicine

## 2017-10-06 NOTE — Telephone Encounter (Signed)
Put CMN for CPAP supplies back into [facility] box.  The patient needs an appt. Has not been seen since 2017.

## 2018-02-19 ENCOUNTER — Ambulatory Visit (INDEPENDENT_AMBULATORY_CARE_PROVIDER_SITE_OTHER): Payer: Medicare Other | Admitting: Internal Medicine

## 2018-02-19 ENCOUNTER — Encounter: Payer: Self-pay | Admitting: Internal Medicine

## 2018-02-19 VITALS — BP 154/92 | HR 64 | Resp 16 | Ht 65.0 in | Wt 240.0 lb

## 2018-02-19 DIAGNOSIS — G4733 Obstructive sleep apnea (adult) (pediatric): Secondary | ICD-10-CM | POA: Diagnosis not present

## 2018-02-19 DIAGNOSIS — Z9989 Dependence on other enabling machines and devices: Secondary | ICD-10-CM | POA: Diagnosis not present

## 2018-02-19 NOTE — Patient Instructions (Signed)

## 2018-02-19 NOTE — Progress Notes (Signed)
[facility] [address] [place], [place] [address]  Pulmonary Sleep Medicine   Office Visit Note  Patient Name: Troy PINES Sr. DOB: 1950/06/20 MRN [id number]  Date of Service: 02/19/2018  Complaints/HPI: Pt is here for follow up after 2 years.  He continues to do well. He is wearing his cpap nightly and he continues to have excellent symptom relief.  He denies snoring, excessive fatigue or hypersomnolence.  He is here today requesting a new prescription to continue to get his supplies.  He uses [facility] for his supplies and they told him his prescription has expired.  ROS  General: (-) fever, (-) chills, (-) night sweats, (-) weakness Skin: (-) rashes, (-) itching,. Eyes: (-) visual changes, (-) redness, (-) itching. Nose and Sinuses: (-) nasal stuffiness or itchiness, (-) postnasal drip, (-) nosebleeds, (-) sinus trouble. Mouth and Throat: (-) sore throat, (-) hoarseness. Neck: (-) swollen glands, (-) enlarged thyroid, (-) neck pain. Respiratory: - cough, (-) bloody sputum, - shortness of breath, - wheezing. Cardiovascular: - ankle swelling, (-) chest pain. Lymphatic: (-) lymph node enlargement. Neurologic: (-) numbness, (-) tingling. Psychiatric: (-) anxiety, (-) depression   Current Medication: Outpatient Encounter Medications as of 02/19/2018  Medication Sig Note  . aspirin EC 81 MG tablet Take 81 mg by mouth daily.   Marland Kitchen diltiazem (CARDIZEM LA) 360 MG 24 hr tablet Take by mouth. 10/13/2015: Received from: [facility] Received Sig: Take 360 mg by mouth.  . metFORMIN (GLUCOPHAGE) 500 MG tablet Take by mouth.   . nitroGLYCERIN (NITROSTAT) 0.4 MG SL tablet PLACE ONE TABLET UNDER THE TONGUE AS NEEDED FOR CHEST PAIN.(MAX IS 3 DOSES, IF NO RELIEF CALL 911).   Marland Kitchen omeprazole (PRILOSEC) 20 MG capsule Take 20 mg by mouth daily. 06/26/2013: Received Sig:   . quinapril-hydrochlorothiazide (ACCURETIC) 20-12.5 MG per tablet Take 1 tablet by mouth daily.  06/26/2013: \ Received Sig:   . tamsulosin (FLOMAX) 0.4 MG CAPS capsule Take 0.4 mg by mouth daily. 06/26/2013: Received Sig:   . [DISCONTINUED] diltiazem (TIAZAC) 360 MG 24 hr capsule Take 360 mg by mouth daily. 06/26/2013: Received Sig:   . [DISCONTINUED] traMADol (ULTRAM) 50 MG tablet Take 1 tablet (50 mg total) by mouth every 6 (six) hours as needed. (Patient not taking: Reported on 02/19/2018)    No facility-administered encounter medications on file as of 02/19/2018.     Surgical History: Past Surgical History:  Procedure Laterality Date  . HERNIA REPAIR      Medical History: Past Medical History:  Diagnosis Date  . Diabetes (HCC)   . Hypertension   . Sleep apnea     Family History: Family History  Problem Relation Age of Onset  . Cancer Mother        breast   . Congestive Heart Failure Father   . Diabetes Sister     Social History: Social History   Socioeconomic History  . Marital status: Married    Spouse name: Not on file  . Number of children: Not on file  . Years of education: Not on file  . Highest education level: Not on file  Occupational History  . Not on file  Social Needs  . Financial resource strain: Not on file  . Food insecurity:    Worry: Not on file    Inability: Not on file  . Transportation needs:    Medical: Not on file    Non-medical: Not on file  Tobacco Use  . Smoking status: Never  Smoker  . Smokeless tobacco: Former Engineer, water and Sexual Activity  . Alcohol use: No  . Drug use: No  . Sexual activity: Not on file  Lifestyle  . Physical activity:    Days per week: Not on file    Minutes per session: Not on file  . Stress: Not on file  Relationships  . Social connections:    Talks on phone: Not on file    Gets together: Not on file    Attends religious service: Not on file    Active member of club or organization: Not on file    Attends meetings of clubs or organizations: Not on file    Relationship status: Not on file  .  Intimate partner violence:    Fear of current or ex partner: Not on file    Emotionally abused: Not on file    Physically abused: Not on file    Forced sexual activity: Not on file  Other Topics Concern  . Not on file  Social History Narrative  . Not on file    Vital Signs: Blood pressure (!) 154/92, pulse 64, resp. rate 16, height 5\' 5"  (1.651 m), weight 240 lb (108.9 kg), SpO2 96 %.  Examination: General Appearance: The patient is well-developed, well-nourished, and in no distress. Skin: Gross inspection of skin unremarkable. Head: normocephalic, no gross deformities. Eyes: no gross deformities noted. ENT: ears appear grossly normal no exudates. Neck: Supple. No thyromegaly. No LAD. Respiratory: clear bilaterally. . Cardiovascular: Normal S1 and S2 without murmur or rub. Extremities: No cyanosis. pulses are equal. Neurologic: Alert and oriented. No involuntary movements.  LABS: No results found for this or any previous visit (from the past 2160 hour(s)).  Radiology: No results found.  No results found.  No results found.    Assessment and Plan: Patient Active Problem List   Diagnosis Date Noted  . Type 2 diabetes mellitus, controlled (HCC) 10/13/2015  . Bilateral inguinal hernia 03/08/2014  . MRSA (methicillin resistant Staphylococcus aureus) infection 09/02/2013  . Dyslipidemia 12/14/2012   1. OSA on CPAP Patient continues to report excellent symptom relief using his CPAP.  Prescription for CPAP supplies provided at this time encourage patient continue to use CPAP whenever sleeping or napping.   General Counseling: I have discussed the findings of the evaluation and examination with Molly Maduro.  I have also discussed any further diagnostic evaluation thatmay be needed or ordered today. Normand verbalizes understanding of the findings of todays visit. We also reviewed his medications today and discussed drug interactions and side effects including but not limited  excessive drowsiness and altered mental states. We also discussed that there is always a risk not just to him but also people around him. he has been encouraged to call the office with any questions or concerns that should arise related to todays visit.    Time spent: 25 This patient was seen by Blima Ledger AGNP-C in Collaboration with Dr. Freda Munro as a part of collaborative care agreement.  I have personally obtained a history, examined the patient, evaluated laboratory and imaging results, formulated the assessment and plan and placed orders.    Yevonne Pax, MD [facility] Pulmonary and Critical Care Sleep medicine

## 2018-02-23 ENCOUNTER — Telehealth: Payer: Self-pay

## 2018-02-23 NOTE — Telephone Encounter (Signed)
Gave [facility] RX for cpap supplies. Beth

## 2018-03-08 NOTE — Addendum Note (Signed)
Addended by: Freda Munro on: 03/08/2018 09:48 PM   Modules accepted: Level of Service

## 2019-02-16 ENCOUNTER — Telehealth: Payer: Self-pay

## 2019-02-16 NOTE — Telephone Encounter (Signed)
Confirmed virtual visit with patient. klh 

## 2019-02-18 ENCOUNTER — Encounter: Payer: Self-pay | Admitting: Internal Medicine

## 2019-02-18 ENCOUNTER — Ambulatory Visit (INDEPENDENT_AMBULATORY_CARE_PROVIDER_SITE_OTHER): Payer: Medicare Other | Admitting: Internal Medicine

## 2019-02-18 ENCOUNTER — Other Ambulatory Visit: Payer: Self-pay

## 2019-02-18 VITALS — Ht 67.0 in

## 2019-02-18 DIAGNOSIS — Z9989 Dependence on other enabling machines and devices: Secondary | ICD-10-CM | POA: Diagnosis not present

## 2019-02-18 DIAGNOSIS — G4733 Obstructive sleep apnea (adult) (pediatric): Secondary | ICD-10-CM

## 2019-02-18 NOTE — Progress Notes (Signed)
[facility] [place], [place] [address]  Internal MEDICINE  Telephone Visit  Patient Name: Troy HENDRICKSEN Sr.  [id number]  [id number]  Date of Service: 02/18/2019  I connected with the patient at 1159 by telephone and verified the patients identity using two identifiers.   I discussed the limitations, risks, security and privacy concerns of performing an evaluation and management service by telephone and the availability of in person appointments. I also discussed with the patient that there may be a patient responsible charge related to the service.  The patient expressed understanding and agrees to proceed.    Chief Complaint  Patient presents with  . Telephone Assessment  . Telephone Screen  . Sleep Apnea    HPI  Pt seen via telephone.  He is seen today for follow up on osa. Pt reports good compliance with CPAP therapy. Cleaning machine by hand, and changing filters and tubing as directed. Denies headaches, sinus issues, palpitations, or hemoptysis. He denies any issues at this time.     Current Medication: Outpatient Encounter Medications as of 02/18/2019  Medication Sig Note  . aspirin EC 81 MG tablet Take 81 mg by mouth daily.   Marland Kitchen diltiazem (CARDIZEM LA) 360 MG 24 hr tablet Take by mouth. 10/13/2015: Received from: [facility]: Take 360 mg by mouth.  . metFORMIN (GLUCOPHAGE) 500 MG tablet Take by mouth.   . nitroGLYCERIN (NITROSTAT) 0.4 MG SL tablet PLACE ONE TABLET UNDER THE TONGUE AS NEEDED FOR CHEST PAIN.(MAX IS 3 DOSES, IF NO RELIEF CALL 911).   Marland Kitchen omeprazole (PRILOSEC) 20 MG capsule Take 20 mg by mouth daily. 06/26/2013: Received Sig:   . quinapril-hydrochlorothiazide (ACCURETIC) 20-12.5 MG per tablet Take 1 tablet by mouth daily. 06/26/2013: \ Received Sig:   . tamsulosin (FLOMAX) 0.4 MG CAPS capsule Take 0.4 mg by mouth daily. 06/26/2013: Received Sig:    No facility-administered encounter medications on file as of  02/18/2019.    Surgical History: Past Surgical History:  Procedure Laterality Date  . HERNIA REPAIR      Medical History: Past Medical History:  Diagnosis Date  . Diabetes ([place])   . Hypertension   . Sleep apnea     Family History: Family History  Problem Relation Age of Onset  . Cancer Mother        breast   . Congestive Heart Failure Father   . Diabetes Sister     Social History   Socioeconomic History  . Marital status: Married    Spouse name: Not on file  . Number of children: Not on file  . Years of education: Not on file  . Highest education level: Not on file  Occupational History  . Not on file  Tobacco Use  . Smoking status: Former Smoker    Types: Cigarettes  . Smokeless tobacco: Never Used  Substance and Sexual Activity  . Alcohol use: No  . Drug use: No  . Sexual activity: Not on file  Other Topics Concern  . Not on file  Social History Narrative  . Not on file   Social Determinants of Health   Financial Resource Strain:   . Difficulty of Paying Living Expenses: Not on file  Food Insecurity:   . Worried About Charity fundraiser in the Last Year: Not on file  . Ran Out of Food in the Last Year: Not on file  Transportation Needs:   . Lack of Transportation (Medical): Not on file  .  Lack of Transportation (Non-Medical): Not on file  Physical Activity:   . Days of Exercise per Week: Not on file  . Minutes of Exercise per Session: Not on file  Stress:   . Feeling of Stress : Not on file  Social Connections:   . Frequency of Communication with Friends and Family: Not on file  . Frequency of Social Gatherings with Friends and Family: Not on file  . Attends Religious Services: Not on file  . Active Member of Clubs or Organizations: Not on file  . Attends Banker Meetings: Not on file  . Marital Status: Not on file  Intimate Partner Violence:   . Fear of Current or Ex-Partner: Not on file  . Emotionally Abused: Not on file  .  Physically Abused: Not on file  . Sexually Abused: Not on file      Review of Systems  Constitutional: Negative.  Negative for chills, fatigue and unexpected weight change.  HENT: Negative.  Negative for congestion, rhinorrhea, sneezing and sore throat.   Eyes: Negative for redness.  Respiratory: Negative.  Negative for cough, chest tightness and shortness of breath.   Cardiovascular: Negative.  Negative for chest pain and palpitations.  Gastrointestinal: Negative.  Negative for abdominal pain, constipation, diarrhea, nausea and vomiting.  Endocrine: Negative.   Genitourinary: Negative.  Negative for dysuria and frequency.  Musculoskeletal: Negative.  Negative for arthralgias, back pain, joint swelling and neck pain.  Skin: Negative.  Negative for rash.  Allergic/Immunologic: Negative.   Neurological: Negative.  Negative for tremors and numbness.  Hematological: Negative for adenopathy. Does not bruise/bleed easily.  Psychiatric/Behavioral: Negative.  Negative for behavioral problems, sleep disturbance and suicidal ideas. The patient is not nervous/anxious.     Vital Signs: Ht 5\' 7"  (1.702 m)   BMI 37.59 kg/m    Observation/Objective:  Well sounding, NAD noted at this time.    Assessment/Plan: 1. OSA on CPAP Continue to wear cpap as directed.  Excellent relief of symptoms at this time.    General Counseling: drewey begue understanding of the findings of today's phone visit and agrees with plan of treatment. I have discussed any further diagnostic evaluation that may be needed or ordered today. We also reviewed his medications today. he has been encouraged to call the office with any questions or concerns that should arise related to todays visit.    No orders of the defined types were placed in this encounter.   No orders of the defined types were placed in this encounter.   Time spent: 15  Minutes    Nelva Bush AGNP-C Internal medicine

## 2019-08-19 ENCOUNTER — Telehealth: Payer: Self-pay

## 2019-08-19 NOTE — Telephone Encounter (Signed)
Gave [facility] for cpap supply order. Beth

## 2019-09-14 ENCOUNTER — Encounter: Payer: Self-pay | Admitting: Internal Medicine

## 2019-09-14 ENCOUNTER — Other Ambulatory Visit: Payer: Self-pay

## 2019-09-14 ENCOUNTER — Ambulatory Visit (INDEPENDENT_AMBULATORY_CARE_PROVIDER_SITE_OTHER): Payer: Medicare Other | Admitting: Internal Medicine

## 2019-09-14 DIAGNOSIS — Z9989 Dependence on other enabling machines and devices: Secondary | ICD-10-CM | POA: Diagnosis not present

## 2019-09-14 DIAGNOSIS — I10 Essential (primary) hypertension: Secondary | ICD-10-CM

## 2019-09-14 DIAGNOSIS — G4733 Obstructive sleep apnea (adult) (pediatric): Secondary | ICD-10-CM | POA: Diagnosis not present

## 2019-09-14 NOTE — Progress Notes (Signed)
[facility] [address] [place], [place] [address]  Pulmonary Sleep Medicine   Office Visit Note  Patient Name: Troy COURT Sr. DOB: 05-11-1950 MRN [id number]  Date of Service: 09/14/2019  Complaints/HPI: Patient is here for routine follow-up. He wears his CPAP nightly for OSA. Wears a full face seal, no complaints of leaks or discomfort. Not currently putting water in his water chamber. He denies congestion, dry mouth or nose. He is cleaning his machine by hand with warm water and vinegar and getting new supplies as needed. He reports benefit from his CPAP with his sleeping. Denies headaches on awakening or bloating in his stomach. BP and HR well controlled today. Quit smoking over 30 years ago.  ROS  General: (-) fever, (-) chills, (-) night sweats, (-) weakness Skin: (-) rashes, (-) itching,. Eyes: (-) visual changes, (-) redness, (-) itching. Nose and Sinuses: (-) nasal stuffiness or itchiness, (-) postnasal drip, (-) nosebleeds, (-) sinus trouble. Mouth and Throat: (-) sore throat, (-) hoarseness. Neck: (-) swollen glands, (-) enlarged thyroid, (-) neck pain. Respiratory: - cough, (-) bloody sputum, - shortness of breath, - wheezing. Cardiovascular: - ankle swelling, (-) chest pain. Lymphatic: (-) lymph node enlargement. Neurologic: (-) numbness, (-) tingling. Psychiatric: (-) anxiety, (-) depression   Current Medication: Outpatient Encounter Medications as of 09/14/2019  Medication Sig Note   aspirin EC 81 MG tablet Take 81 mg by mouth daily.    diltiazem (CARDIZEM LA) 360 MG 24 hr tablet Take by mouth. 10/13/2015: Received from: [facility] Received Sig: Take 360 mg by mouth.   nitroGLYCERIN (NITROSTAT) 0.4 MG SL tablet PLACE ONE TABLET UNDER THE TONGUE AS NEEDED FOR CHEST PAIN.(MAX IS 3 DOSES, IF NO RELIEF CALL 911).    omeprazole (PRILOSEC) 20 MG capsule Take 20 mg by mouth daily. 06/26/2013: Received Sig:     quinapril-hydrochlorothiazide (ACCURETIC) 20-12.5 MG per tablet Take 1 tablet by mouth daily. 06/26/2013: \ Received Sig:    tamsulosin (FLOMAX) 0.4 MG CAPS capsule Take 0.4 mg by mouth daily. 06/26/2013: Received Sig:    metFORMIN (GLUCOPHAGE) 500 MG tablet Take by mouth.    No facility-administered encounter medications on file as of 09/14/2019.    Surgical History: Past Surgical History:  Procedure Laterality Date   HERNIA REPAIR      Medical History: Past Medical History:  Diagnosis Date   Diabetes ([facility])    Hypertension    Sleep apnea     Family History: Family History  Problem Relation Age of Onset   Cancer Mother        breast    Congestive Heart Failure Father    Diabetes Sister     Social History: Social History   Socioeconomic History   Marital status: Married    Spouse name: Not on file   Number of children: Not on file   Years of education: Not on file   Highest education level: Not on file  Occupational History   Not on file  Tobacco Use   Smoking status: Former Smoker    Types: Cigarettes   Smokeless tobacco: Never Used  Building services engineer Use: Never used  Substance and Sexual Activity   Alcohol use: No   Drug use: No   Sexual activity: Not on file  Other Topics Concern   Not on file  Social History Narrative   Not on file   Social Determinants of Health   Financial Resource Strain:    Difficulty of  Paying Living Expenses: Not on file  Food Insecurity:    Worried About Running Out of Food in the Last Year: Not on file   Ran Out of Food in the Last Year: Not on file  Transportation Needs:    Lack of Transportation (Medical): Not on file   Lack of Transportation (Non-Medical): Not on file  Physical Activity:    Days of Exercise per Week: Not on file   Minutes of Exercise per Session: Not on file  Stress:    Feeling of Stress : Not on file  Social Connections:    Frequency of Communication with Friends and  Family: Not on file   Frequency of Social Gatherings with Friends and Family: Not on file   Attends Religious Services: Not on file   Active Member of Clubs or Organizations: Not on file   Attends Banker Meetings: Not on file   Marital Status: Not on file  Intimate Partner Violence:    Fear of Current or Ex-Partner: Not on file   Emotionally Abused: Not on file   Physically Abused: Not on file   Sexually Abused: Not on file    Vital Signs: Blood pressure 140/78, pulse 77, temperature (!) 97.3 F (36.3 C), resp. rate 16, height 5\' 7"  (1.702 m), weight 233 lb 12.8 oz (106.1 kg), SpO2 97 %.  Examination: General Appearance: The patient is well-developed, well-nourished, and in no distress. Skin: Gross inspection of skin unremarkable. Head: normocephalic, no gross deformities. Eyes: no gross deformities noted. ENT: ears appear grossly normal no exudates. Neck: Supple. No thyromegaly. No LAD. Respiratory: Clear lung sounds bilaterally. Cardiovascular: Normal S1 and S2 without murmur or rub. Extremities: No cyanosis. pulses are equal. Neurologic: Alert and oriented. No involuntary movements.  LABS: No results found for this or any previous visit (from the past 2160 hour(s)).  Radiology: No results found.  No results found.  No results found.    Assessment and Plan: Patient Active Problem List   Diagnosis Date Noted   Type 2 diabetes mellitus, controlled (HCC) 10/13/2015   Bilateral inguinal hernia 03/08/2014   MRSA (methicillin resistant Staphylococcus aureus) infection 09/02/2013   Dyslipidemia 12/14/2012    1. OSA on CPAP Continue with current CPAP settings and encouraged to continue wearing his CPAP nightly and if he lies down for napping. Discussed proper ways to clean his machine. Will be seen 9/1 for compliance download.   2. Essential hypertension BP slightly elevated today, encouraged to routinely monitor his BP at home and to  follow-up accordingly with his PCP.  General Counseling: I have discussed the findings of the evaluation and examination with 11/1.  I have also discussed any further diagnostic evaluation thatmay be needed or ordered today. Troy Cooper verbalizes understanding of the findings of todays visit. We also reviewed his medications today and discussed drug interactions and side effects including but not limited excessive drowsiness and altered mental states. We also discussed that there is always a risk not just to him but also people around him. he has been encouraged to call the office with any questions or concerns that should arise related to todays visit.  No orders of the defined types were placed in this encounter.    Time spent: 20  I have personally obtained a history, examined the patient, evaluated laboratory and imaging results, formulated the assessment and plan and placed orders.  This patient was seen by Troy Maduro AGNP-C in Collaboration with Dr. Brent General as a part of collaborative  care agreement.    Yevonne Pax, MD [facility] Pulmonary and Critical Care Sleep medicine

## 2019-09-14 NOTE — Patient Instructions (Signed)

## 2019-09-14 NOTE — Addendum Note (Signed)
Addended by: Freda Munro on: 09/14/2019 09:09 PM   Modules accepted: Level of Service

## 2019-09-16 ENCOUNTER — Ambulatory Visit: Payer: No Typology Code available for payment source | Admitting: Internal Medicine

## 2019-09-21 ENCOUNTER — Telehealth: Payer: Self-pay

## 2019-09-21 NOTE — Telephone Encounter (Signed)
Physician's certification for positive airway pressure devices signed by provider and placed in [facility] folder.

## 2019-09-22 ENCOUNTER — Other Ambulatory Visit: Payer: Self-pay

## 2019-09-22 ENCOUNTER — Ambulatory Visit: Payer: Medicare Other

## 2020-02-21 ENCOUNTER — Ambulatory Visit (INDEPENDENT_AMBULATORY_CARE_PROVIDER_SITE_OTHER): Payer: Medicare Other | Admitting: Hospice and Palliative Medicine

## 2020-02-21 ENCOUNTER — Encounter: Payer: Self-pay | Admitting: Hospice and Palliative Medicine

## 2020-02-21 VITALS — BP 146/82 | HR 63 | Temp 97.9°F | Resp 16 | Ht 67.0 in | Wt 236.0 lb

## 2020-02-21 DIAGNOSIS — I10 Essential (primary) hypertension: Secondary | ICD-10-CM | POA: Diagnosis not present

## 2020-02-21 DIAGNOSIS — G4733 Obstructive sleep apnea (adult) (pediatric): Secondary | ICD-10-CM | POA: Diagnosis not present

## 2020-02-21 DIAGNOSIS — Z9989 Dependence on other enabling machines and devices: Secondary | ICD-10-CM | POA: Diagnosis not present

## 2020-02-21 DIAGNOSIS — Z7189 Other specified counseling: Secondary | ICD-10-CM | POA: Diagnosis not present

## 2020-02-21 NOTE — Progress Notes (Signed)
[facility] [address] [place], [place] [address]  Pulmonary Sleep Medicine   Office Visit Note  Patient Name: Troy KADER Sr. DOB: January 25, 1950 MRN [id number]  Date of Service: 02/23/2020  Complaints/HPI: Patient is here for routine pulmonary follow-up Followed for OSA on CPAP--reports nightly compliance Continues to sleep well with CPAP, feels rested during the day No issues reported from using CPAP, cleaning machine by hand He would like to know if he is eligible to get a new machine, thinks his current machine is greater than 29 years old Discussed his elevated BP--was just seen by PCP and BP was well controlled, denies chest pain, headaches or visual disturbances  ROS  General: (-) fever, (-) chills, (-) night sweats, (-) weakness Skin: (-) rashes, (-) itching,. Eyes: (-) visual changes, (-) redness, (-) itching. Nose and Sinuses: (-) nasal stuffiness or itchiness, (-) postnasal drip, (-) nosebleeds, (-) sinus trouble. Mouth and Throat: (-) sore throat, (-) hoarseness. Neck: (-) swollen glands, (-) enlarged thyroid, (-) neck pain. Respiratory: - cough, (-) bloody sputum, - shortness of breath, - wheezing. Cardiovascular: - ankle swelling, (-) chest pain. Lymphatic: (-) lymph node enlargement. Neurologic: (-) numbness, (-) tingling. Psychiatric: (-) anxiety, (-) depression   Current Medication: Outpatient Encounter Medications as of 02/21/2020  Medication Sig Note  . aspirin EC 81 MG tablet Take 81 mg by mouth daily.   Marland Kitchen diltiazem (CARDIZEM LA) 360 MG 24 hr tablet Take by mouth. 10/13/2015: Received from: [facility] Received Sig: Take 360 mg by mouth.  . nitroGLYCERIN (NITROSTAT) 0.4 MG SL tablet PLACE ONE TABLET UNDER THE TONGUE AS NEEDED FOR CHEST PAIN.(MAX IS 3 DOSES, IF NO RELIEF CALL 911).   Marland Kitchen omeprazole (PRILOSEC) 20 MG capsule Take 20 mg by mouth daily. 06/26/2013: Received Sig:   . quinapril-hydrochlorothiazide (ACCURETIC) 20-12.5  MG per tablet Take 1 tablet by mouth daily. 06/26/2013: \ Received Sig:   . tamsulosin (FLOMAX) 0.4 MG CAPS capsule Take 0.4 mg by mouth daily. 06/26/2013: Received Sig:   . metFORMIN (GLUCOPHAGE) 500 MG tablet Take by mouth.    No facility-administered encounter medications on file as of 02/21/2020.    Surgical History: Past Surgical History:  Procedure Laterality Date  . HERNIA REPAIR      Medical History: Past Medical History:  Diagnosis Date  . Diabetes (HCC)   . Hypertension   . Sleep apnea     Family History: Family History  Problem Relation Age of Onset  . Cancer Mother        breast   . Congestive Heart Failure Father   . Diabetes Sister     Social History: Social History   Socioeconomic History  . Marital status: Married    Spouse name: Not on file  . Number of children: Not on file  . Years of education: Not on file  . Highest education level: Not on file  Occupational History  . Not on file  Tobacco Use  . Smoking status: Former Smoker    Types: Cigarettes  . Smokeless tobacco: Never Used  Vaping Use  . Vaping Use: Never used  Substance and Sexual Activity  . Alcohol use: No  . Drug use: No  . Sexual activity: Not on file  Other Topics Concern  . Not on file  Social History Narrative  . Not on file   Social Determinants of Health   Financial Resource Strain: Not on file  Food Insecurity: Not on file  Transportation Needs: Not  on file  Physical Activity: Not on file  Stress: Not on file  Social Connections: Not on file  Intimate Partner Violence: Not on file    Vital Signs: Blood pressure (!) 146/82, pulse 63, temperature 97.9 F (36.6 C), resp. rate 16, height 5\' 7"  (1.702 m), weight 236 lb (107 kg), SpO2 97 %.  Examination: General Appearance: The patient is well-developed, well-nourished, and in no distress. Skin: Gross inspection of skin unremarkable. Head: normocephalic, no gross deformities. Eyes: no gross deformities noted. ENT:  ears appear grossly normal no exudates. Neck: Supple. No thyromegaly. No LAD. Respiratory: Clear throughout, no rhonchi, wheeze or rales noted. Cardiovascular: Normal S1 and S2 without murmur or rub. Extremities: No cyanosis. pulses are equal. Neurologic: Alert and oriented. No involuntary movements.  LABS: No results found for this or any previous visit (from the past 2160 hour(s)).  Radiology: No results found.  No results found.  No results found.    Assessment and Plan: Patient Active Problem List   Diagnosis Date Noted  . Type 2 diabetes mellitus, controlled (HCC) 10/13/2015  . Bilateral inguinal hernia 03/08/2014  . MRSA (methicillin resistant Staphylococcus aureus) infection 09/02/2013  . Dyslipidemia 12/14/2012    1. OSA on CPAP Continue nightly compliance Will request download from DME company for review Will also contact DME company to look into getting a new machine if he is eligible at this time  2. CPAP use counseling Discussed importance of adequate CPAP use as well as proper care and cleaning techniques of machine and all supplies.  3. Essential hypertension Advised to monitor his BP and contact PCP with consistent elevated readings  General Counseling: I have discussed the findings of the evaluation and examination with 12/16/2012.  I have also discussed any further diagnostic evaluation thatmay be needed or ordered today. Tytan verbalizes understanding of the findings of todays visit. We also reviewed his medications today and discussed drug interactions and side effects including but not limited excessive drowsiness and altered mental states. We also discussed that there is always a risk not just to him but also people around him. he has been encouraged to call the office with any questions or concerns that should arise related to todays visit.     Time spent: 25  I have personally obtained a history, examined the patient, evaluated laboratory and imaging  results, formulated the assessment and plan and placed orders. This patient was seen by 26 AGNP-C in Collaboration with Dr. Brent General as a part of collaborative care agreement.    Freda Munro, MD [facility] Pulmonary and Critical Care Sleep medicine

## 2020-02-23 ENCOUNTER — Encounter: Payer: Self-pay | Admitting: Hospice and Palliative Medicine

## 2020-02-23 NOTE — Patient Instructions (Signed)

## 2020-03-15 ENCOUNTER — Telehealth: Payer: Self-pay

## 2020-03-15 NOTE — Telephone Encounter (Signed)
Gave [facility] orders for new cpap set up. Brandalyn Harting

## 2020-03-16 ENCOUNTER — Ambulatory Visit: Payer: No Typology Code available for payment source | Admitting: Internal Medicine

## 2020-08-09 ENCOUNTER — Ambulatory Visit (INDEPENDENT_AMBULATORY_CARE_PROVIDER_SITE_OTHER): Payer: Medicare Other

## 2020-08-09 ENCOUNTER — Other Ambulatory Visit: Payer: Self-pay

## 2020-08-09 DIAGNOSIS — G4733 Obstructive sleep apnea (adult) (pediatric): Secondary | ICD-10-CM

## 2020-08-09 NOTE — Progress Notes (Signed)
95 percentile pressure 15.8   95th percentile leak 9.1   apnea index 5.9 /hr  apnea-hypopnea index  8.2 /hr   total days used  >4 hr 87 days  total days used <4 hr 1 days  Total compliance 97 percent   He wearing cpap great, his ahi has been slowly going up now at 8.2. He states he feeling good just an occasional tiredness during day. Will follow up with him in 6 months

## 2020-08-28 ENCOUNTER — Encounter: Payer: Self-pay | Admitting: Internal Medicine

## 2020-08-28 ENCOUNTER — Ambulatory Visit (INDEPENDENT_AMBULATORY_CARE_PROVIDER_SITE_OTHER): Payer: Medicare Other | Admitting: Internal Medicine

## 2020-08-28 ENCOUNTER — Other Ambulatory Visit: Payer: Self-pay

## 2020-08-28 VITALS — BP 134/84 | HR 68 | Temp 98.1°F | Resp 16 | Ht 67.0 in | Wt 235.4 lb

## 2020-08-28 DIAGNOSIS — G4733 Obstructive sleep apnea (adult) (pediatric): Secondary | ICD-10-CM

## 2020-08-28 DIAGNOSIS — I10 Essential (primary) hypertension: Secondary | ICD-10-CM | POA: Diagnosis not present

## 2020-08-28 DIAGNOSIS — Z7189 Other specified counseling: Secondary | ICD-10-CM

## 2020-08-28 NOTE — Patient Instructions (Signed)

## 2020-08-28 NOTE — Progress Notes (Signed)
[facility] [address] [place], [place] [address]  Pulmonary Sleep Medicine   Office Visit Note  Patient Name: Troy VOONG Sr. DOB: 1950-06-29 MRN [id number]  Date of Service: 08/28/2020  Complaints/HPI: OSA. His CPAP device is doing OK. He has had the machine for 4 years. Due for a new machine. Patient states he is noting an odor in the machine. Compliance is excellent He has spoken to the DME provider. Patient has nochest pain noted. He denies having SOB. No fevers no admissions.   ROS  General: (-) fever, (-) chills, (-) night sweats, (-) weakness Skin: (-) rashes, (-) itching,. Eyes: (-) visual changes, (-) redness, (-) itching. Nose and Sinuses: (-) nasal stuffiness or itchiness, (-) postnasal drip, (-) nosebleeds, (-) sinus trouble. Mouth and Throat: (-) sore throat, (-) hoarseness. Neck: (-) swollen glands, (-) enlarged thyroid, (-) neck pain. Respiratory: - cough, (-) bloody sputum, - shortness of breath, - wheezing. Cardiovascular: - ankle swelling, (-) chest pain. Lymphatic: (-) lymph node enlargement. Neurologic: (-) numbness, (-) tingling. Psychiatric: (-) anxiety, (-) depression   Current Medication: Outpatient Encounter Medications as of 08/28/2020  Medication Sig Note   aspirin EC 81 MG tablet Take 81 mg by mouth daily.    diltiazem (CARDIZEM LA) 360 MG 24 hr tablet Take by mouth. 10/13/2015: Received from: [facility] Received Sig: Take 360 mg by mouth.   nitroGLYCERIN (NITROSTAT) 0.4 MG SL tablet PLACE ONE TABLET UNDER THE TONGUE AS NEEDED FOR CHEST PAIN.(MAX IS 3 DOSES, IF NO RELIEF CALL 911).    omeprazole (PRILOSEC) 20 MG capsule Take 20 mg by mouth daily. 06/26/2013: Received Sig:    quinapril-hydrochlorothiazide (ACCURETIC) 20-12.5 MG per tablet Take 1 tablet by mouth daily. 06/26/2013: \ Received Sig:    tamsulosin (FLOMAX) 0.4 MG CAPS capsule Take 0.4 mg by mouth daily. 06/26/2013: Received Sig:    [DISCONTINUED] metFORMIN  (GLUCOPHAGE) 500 MG tablet Take by mouth.    No facility-administered encounter medications on file as of 08/28/2020.    Surgical History: Past Surgical History:  Procedure Laterality Date   HERNIA REPAIR      Medical History: Past Medical History:  Diagnosis Date   Diabetes (HCC)    Hypertension    Sleep apnea     Family History: Family History  Problem Relation Age of Onset   Cancer Mother        breast    Congestive Heart Failure Father    Diabetes Sister     Social History: Social History   Socioeconomic History   Marital status: Married    Spouse name: Not on file   Number of children: Not on file   Years of education: Not on file   Highest education level: Not on file  Occupational History   Not on file  Tobacco Use   Smoking status: Former    Types: Cigarettes   Smokeless tobacco: Never  Vaping Use   Vaping Use: Never used  Substance and Sexual Activity   Alcohol use: No   Drug use: No   Sexual activity: Not on file  Other Topics Concern   Not on file  Social History Narrative   Not on file   Social Determinants of Health   Financial Resource Strain: Not on file  Food Insecurity: Not on file  Transportation Needs: Not on file  Physical Activity: Not on file  Stress: Not on file  Social Connections: Not on file  Intimate Partner Violence: Not on file  Vital Signs: Blood pressure 134/84, pulse 68, temperature 98.1 F (36.7 C), resp. rate 16, height 5\' 7"  (1.702 m), weight 235 lb 6.4 oz (106.8 kg), SpO2 97 %.  Examination: General Appearance: The patient is well-developed, well-nourished, and in no distress. Skin: Gross inspection of skin unremarkable. Head: normocephalic, no gross deformities. Eyes: no gross deformities noted. ENT: ears appear grossly normal no exudates. Neck: Supple. No thyromegaly. No LAD. Respiratory: no rhonchi noted. Cardiovascular: Normal S1 and S2 without murmur or rub. Extremities: No cyanosis. pulses are  equal. Neurologic: Alert and oriented. No involuntary movements.  LABS: No results found for this or any previous visit (from the past 2160 hour(s)).  Radiology: No results found.  No results found.  No results found.    Assessment and Plan: Patient Active Problem List   Diagnosis Date Noted   Type 2 diabetes mellitus, controlled (HCC) 10/13/2015   Bilateral inguinal hernia 03/08/2014   MRSA (methicillin resistant Staphylococcus aureus) infection 09/02/2013   Dyslipidemia 12/14/2012    1. Obstructive sleep apnea On CPAP good compliance has some issues with his machine will work with DME provider on replacement  2. CPAP use counseling CPAP Counseling: had a lengthy discussion with the patient regarding the importance of PAP therapy in management of the sleep apnea. Patient appears to understand the risk factor reduction and also understands the risks associated with untreated sleep apnea. Patient will try to make a good faith effort to remain compliant with therapy. Also instructed the patient on proper cleaning of the device including the water must be changed daily if possible and use of distilled water is preferred. Patient understands that the machine should be regularly cleaned with appropriate recommended cleaning solutions that do not damage the PAP machine for example given white vinegar and water rinses. Other methods such as ozone treatment may not be as good as these simple methods to achieve cleaning.   3. Essential hypertension Controlled will follow with PCP  4. Obesity, morbid (HCC) Obesity Counseling: Had a lengthy discussion regarding patients BMI and weight issues. Patient was instructed on portion control as well as increased activity. Also discussed caloric restrictions with trying to maintain intake less than 2000 Kcal. Discussions were made in accordance with the 5As of weight management. Simple actions such as not eating late and if able to, taking a walk is  suggested.    General Counseling: I have discussed the findings of the evaluation and examination with 12/16/2012.  I have also discussed any further diagnostic evaluation thatmay be needed or ordered today. Erek verbalizes understanding of the findings of todays visit. We also reviewed his medications today and discussed drug interactions and side effects including but not limited excessive drowsiness and altered mental states. We also discussed that there is always a risk not just to him but also people around him. he has been encouraged to call the office with any questions or concerns that should arise related to todays visit.  No orders of the defined types were placed in this encounter.    Time spent: 81  I have personally obtained a history, examined the patient, evaluated laboratory and imaging results, formulated the assessment and plan and placed orders.    31, MD [facility] Pulmonary and Critical Care Sleep medicine

## 2021-02-07 ENCOUNTER — Other Ambulatory Visit: Payer: Self-pay

## 2021-02-07 ENCOUNTER — Ambulatory Visit (INDEPENDENT_AMBULATORY_CARE_PROVIDER_SITE_OTHER): Payer: Medicare (Managed Care)

## 2021-02-07 DIAGNOSIS — G4733 Obstructive sleep apnea (adult) (pediatric): Secondary | ICD-10-CM | POA: Diagnosis not present

## 2021-02-07 NOTE — Progress Notes (Signed)
95 percentile pressure 15.8   95th percentile leak 15.0   apnea index 4.8 /hr  apnea-hypopnea index  7.0 /hr   total days used  >4 hr 89 days  total days used <4 hr 1 days  Total compliance 99 percent   He is doing great but is maxing pressure may consider an increase in pressure. He is also eligible for new cpap in March 2023 will discuss with Dr Welton Flakes at his appt in Feb 2023.  Pt was seen by Tresa Endo  RRT/RCP  from [facility]

## 2021-02-26 ENCOUNTER — Telehealth: Payer: Self-pay

## 2021-02-26 ENCOUNTER — Encounter: Payer: Self-pay | Admitting: Physician Assistant

## 2021-02-26 ENCOUNTER — Other Ambulatory Visit: Payer: Self-pay

## 2021-02-26 ENCOUNTER — Ambulatory Visit: Payer: Medicare (Managed Care) | Admitting: Physician Assistant

## 2021-02-26 VITALS — BP 144/84 | HR 56 | Temp 98.0°F | Resp 16 | Ht 67.0 in | Wt 241.0 lb

## 2021-02-26 DIAGNOSIS — I10 Essential (primary) hypertension: Secondary | ICD-10-CM

## 2021-02-26 DIAGNOSIS — G4733 Obstructive sleep apnea (adult) (pediatric): Secondary | ICD-10-CM

## 2021-02-26 DIAGNOSIS — E669 Obesity, unspecified: Secondary | ICD-10-CM | POA: Diagnosis not present

## 2021-02-26 DIAGNOSIS — Z7189 Other specified counseling: Secondary | ICD-10-CM

## 2021-02-26 NOTE — Telephone Encounter (Signed)
Sent community message to [employer] with [facility] to complete order for new Cpap and supplies

## 2021-02-26 NOTE — Progress Notes (Signed)
[facility] [address] [place], [place] [address]  Pulmonary Sleep Medicine   Office Visit Note  Patient Name: Troy EPPS Sr. DOB: 07-28-1950 MRN [id number]  Date of Service: 02/26/2021  Complaints/HPI: Pt is here for routine pulmonary follow up for OSA. States he is benefiting from use and uses pap nightly. He denies any SOB, headaches, or dryness. He is in need of a new machine and is eligible in March therefore will go ahead and place order. States he does well on current settings though discussed his AHI is slightly elevated and may need some adjustment to pressure range if not improving. AHI is 7/hr. He is exercises by walking.   ROS  General: (-) fever, (-) chills, (-) night sweats, (-) weakness Skin: (-) rashes, (-) itching,. Eyes: (-) visual changes, (-) redness, (-) itching. Nose and Sinuses: (-) nasal stuffiness or itchiness, (-) postnasal drip, (-) nosebleeds, (-) sinus trouble. Mouth and Throat: (-) sore throat, (-) hoarseness. Neck: (-) swollen glands, (-) enlarged thyroid, (-) neck pain. Respiratory: - cough, (-) bloody sputum, - shortness of breath, - wheezing. Cardiovascular: - ankle swelling, (-) chest pain. Lymphatic: (-) lymph node enlargement. Neurologic: (-) numbness, (-) tingling. Psychiatric: (-) anxiety, (-) depression   Current Medication: Outpatient Encounter Medications as of 02/26/2021  Medication Sig Note   aspirin EC 81 MG tablet Take 81 mg by mouth daily.    diltiazem (CARDIZEM LA) 360 MG 24 hr tablet Take by mouth. 10/13/2015: Received from: [facility] Received Sig: Take 360 mg by mouth.   nitroGLYCERIN (NITROSTAT) 0.4 MG SL tablet PLACE ONE TABLET UNDER THE TONGUE AS NEEDED FOR CHEST PAIN.(MAX IS 3 DOSES, IF NO RELIEF CALL 911).    omeprazole (PRILOSEC) 20 MG capsule Take 20 mg by mouth daily. 06/26/2013: Received Sig:    quinapril-hydrochlorothiazide (ACCURETIC) 20-12.5 MG per tablet Take 1 tablet by mouth daily.  06/26/2013: \ Received Sig:    tamsulosin (FLOMAX) 0.4 MG CAPS capsule Take 0.4 mg by mouth daily. 06/26/2013: Received Sig:    No facility-administered encounter medications on file as of 02/26/2021.    Surgical History: Past Surgical History:  Procedure Laterality Date   HERNIA REPAIR      Medical History: Past Medical History:  Diagnosis Date   Diabetes (HCC)    Hypertension    Sleep apnea     Family History: Family History  Problem Relation Age of Onset   Cancer Mother        breast    Congestive Heart Failure Father    Diabetes Sister     Social History: Social History   Socioeconomic History   Marital status: Married    Spouse name: Not on file   Number of children: Not on file   Years of education: Not on file   Highest education level: Not on file  Occupational History   Not on file  Tobacco Use   Smoking status: Former    Types: Cigarettes   Smokeless tobacco: Never  Vaping Use   Vaping Use: Never used  Substance and Sexual Activity   Alcohol use: No   Drug use: No   Sexual activity: Not on file  Other Topics Concern   Not on file  Social History Narrative   Not on file   Social Determinants of Health   Financial Resource Strain: Not on file  Food Insecurity: Not on file  Transportation Needs: Not on file  Physical Activity: Not on file  Stress: Not  on file  Social Connections: Not on file  Intimate Partner Violence: Not on file    Vital Signs: Blood pressure (!) 144/84, pulse (!) 56, temperature 98 F (36.7 C), resp. rate 16, height 5\' 7"  (1.702 m), weight 241 lb (109.3 kg), SpO2 97 %.  Examination: General Appearance: The patient is well-developed, well-nourished, and in no distress. Skin: Gross inspection of skin unremarkable. Head: normocephalic, no gross deformities. Eyes: no gross deformities noted. ENT: ears appear grossly normal no exudates. Neck: Supple. No thyromegaly. No LAD. Respiratory: Lungs clear to auscultation  bilaterally. Cardiovascular: Normal S1 and S2 without murmur or rub. Extremities: No cyanosis. pulses are equal. Neurologic: Alert and oriented. No involuntary movements.  LABS: No results found for this or any previous visit (from the past 2160 hour(s)).  Radiology: No results found.  No results found.  No results found.    Assessment and Plan: Patient Active Problem List   Diagnosis Date Noted   Type 2 diabetes mellitus, controlled (HCC) 10/13/2015   Bilateral inguinal hernia 03/08/2014   MRSA (methicillin resistant Staphylococcus aureus) infection 09/02/2013   Dyslipidemia 12/14/2012    1. Obstructive sleep apnea Continue excellent compliance and will order replacement CPAP - For home use only DME continuous positive airway pressure (CPAP)  2. CPAP use counseling CPAP couseling-Discussed importance of adequate CPAP use as well as proper care and cleaning techniques of machine and all supplies.  3. Essential hypertension Continue current medication and f/u with PCP.  4. Obesity (BMI 30-39.9) Obesity Counseling: Had a lengthy discussion regarding patients BMI and weight issues. Patient was instructed on portion control as well as increased activity. Also discussed caloric restrictions with trying to maintain intake less than 2000 Kcal. Discussions were made in accordance with the 5As of weight management. Simple actions such as not eating late and if able to, taking a walk is suggested.    General Counseling: I have discussed the findings of the evaluation and examination with 12/16/2012.  I have also discussed any further diagnostic evaluation thatmay be needed or ordered today. Eastin verbalizes understanding of the findings of todays visit. We also reviewed his medications today and discussed drug interactions and side effects including but not limited excessive drowsiness and altered mental states. We also discussed that there is always a risk not just to him but also people  around him. he has been encouraged to call the office with any questions or concerns that should arise related to todays visit.  Orders Placed This Encounter  Procedures   For home use only DME continuous positive airway pressure (CPAP)    Follow previous APAP settings    Order Specific Question:   Length of Need    Answer:   Lifetime    Order Specific Question:   Patient has OSA or probable OSA    Answer:   Yes    Order Specific Question:   Is the patient currently using CPAP in the home    Answer:   Yes    Order Specific Question:   Settings    Answer:   Autotitration    Order Specific Question:   CPAP supplies needed    Answer:   Mask, headgear, cushions, filters, heated tubing and water chamber     Time spent: 30  I have personally obtained a history, examined the patient, evaluated laboratory and imaging results, formulated the assessment and plan and placed orders. This patient was seen by Molly Maduro, PA-C in collaboration with Dr. Lynn Ito as  a part of collaborative care agreement.     Yevonne Pax, MD [facility] Pulmonary and Critical Care Sleep medicine

## 2021-02-26 NOTE — Patient Instructions (Signed)

## 2021-02-26 NOTE — Telephone Encounter (Signed)
Community message sent to Troy Cooper with [facility] for new Cpap machine and supplies

## 2021-08-08 ENCOUNTER — Ambulatory Visit: Payer: Medicare (Managed Care)

## 2021-08-22 ENCOUNTER — Ambulatory Visit: Payer: Medicare (Managed Care)

## 2021-08-27 ENCOUNTER — Encounter: Payer: Self-pay | Admitting: Physician Assistant

## 2021-08-27 ENCOUNTER — Ambulatory Visit (INDEPENDENT_AMBULATORY_CARE_PROVIDER_SITE_OTHER): Payer: Medicare (Managed Care) | Admitting: Physician Assistant

## 2021-08-27 ENCOUNTER — Telehealth: Payer: Self-pay

## 2021-08-27 VITALS — BP 150/70 | HR 70 | Temp 98.2°F | Resp 16 | Ht 67.0 in | Wt 227.6 lb

## 2021-08-27 DIAGNOSIS — I10 Essential (primary) hypertension: Secondary | ICD-10-CM | POA: Diagnosis not present

## 2021-08-27 DIAGNOSIS — Z7189 Other specified counseling: Secondary | ICD-10-CM

## 2021-08-27 DIAGNOSIS — E669 Obesity, unspecified: Secondary | ICD-10-CM

## 2021-08-27 DIAGNOSIS — G4733 Obstructive sleep apnea (adult) (pediatric): Secondary | ICD-10-CM | POA: Diagnosis not present

## 2021-08-27 NOTE — Patient Instructions (Signed)

## 2021-08-27 NOTE — Telephone Encounter (Signed)
Sent community message to [place] at [facility] to check on order sent for pt for new APAP machine.  We sent one back in feb 2023 but never heard anything.  Asked Ashly to get back with me to let me know the status.

## 2021-08-27 NOTE — Progress Notes (Signed)
[facility] [address] [place], [place] [address]  Pulmonary Sleep Medicine   Office Visit Note  Patient Name: Troy WAGLEY Sr. DOB: Oct 20, 1950 MRN [id number]  Date of Service: 08/27/2021  Complaints/HPI: Pt is here for routine pulmonary follow up for OSA on CPAP.he uses [facility] and was supposed to get new machine but hasn't heard anything about this. He uses cpap nightly. Cant sleep without it. Denies any SOB or dryness. He is benefiting from use. Humidifier/water chamber seems to not be working right on old machine. Is overdue for new machine and would like to pursue this again. New order was placed. States he is on APAP but unsure exactly what range. Has upcoming download on the 16th.   ROS  General: (-) fever, (-) chills, (-) night sweats, (-) weakness Skin: (-) rashes, (-) itching,. Eyes: (-) visual changes, (-) redness, (-) itching. Nose and Sinuses: (-) nasal stuffiness or itchiness, (-) postnasal drip, (-) nosebleeds, (-) sinus trouble. Mouth and Throat: (-) sore throat, (-) hoarseness. Neck: (-) swollen glands, (-) enlarged thyroid, (-) neck pain. Respiratory: - cough, (-) bloody sputum, - shortness of breath, - wheezing. Cardiovascular: - ankle swelling, (-) chest pain. Lymphatic: (-) lymph node enlargement. Neurologic: (-) numbness, (-) tingling. Psychiatric: (-) anxiety, (-) depression   Current Medication: Outpatient Encounter Medications as of 08/27/2021  Medication Sig Note   aspirin EC 81 MG tablet Take 81 mg by mouth daily.    diltiazem (CARDIZEM LA) 360 MG 24 hr tablet Take by mouth. 10/13/2015: Received from: [facility] Received Sig: Take 360 mg by mouth.   lisinopril-hydrochlorothiazide (ZESTORETIC) 20-12.5 MG tablet Take 1 tablet by mouth daily.    nitroGLYCERIN (NITROSTAT) 0.4 MG SL tablet PLACE ONE TABLET UNDER THE TONGUE AS NEEDED FOR CHEST PAIN.(MAX IS 3 DOSES, IF NO RELIEF CALL 911).    omeprazole (PRILOSEC) 20 MG  capsule Take 20 mg by mouth daily. 06/26/2013: Received Sig:    tamsulosin (FLOMAX) 0.4 MG CAPS capsule Take 0.4 mg by mouth daily. 06/26/2013: Received Sig:    [DISCONTINUED] quinapril-hydrochlorothiazide (ACCURETIC) 20-12.5 MG per tablet Take 1 tablet by mouth daily. (Patient not taking: Reported on 08/27/2021) 06/26/2013: \ Received Sig:    No facility-administered encounter medications on file as of 08/27/2021.    Surgical History: Past Surgical History:  Procedure Laterality Date   HERNIA REPAIR      Medical History: Past Medical History:  Diagnosis Date   Diabetes (HCC)    Hypertension    Sleep apnea     Family History: Family History  Problem Relation Age of Onset   Cancer Mother        breast    Congestive Heart Failure Father    Diabetes Sister     Social History: Social History   Socioeconomic History   Marital status: Widowed    Spouse name: Not on file   Number of children: Not on file   Years of education: Not on file   Highest education level: Not on file  Occupational History   Not on file  Tobacco Use   Smoking status: Former    Types: Cigarettes   Smokeless tobacco: Never  Vaping Use   Vaping Use: Never used  Substance and Sexual Activity   Alcohol use: No   Drug use: No   Sexual activity: Not on file  Other Topics Concern   Not on file  Social History Narrative   Not on file   Social Determinants of Health  Financial Resource Strain: Not on file  Food Insecurity: Not on file  Transportation Needs: Not on file  Physical Activity: Not on file  Stress: Not on file  Social Connections: Not on file  Intimate Partner Violence: Not on file    Vital Signs: Blood pressure (!) 150/70, pulse 70, temperature 98.2 F (36.8 C), resp. rate 16, height 5\' 7"  (1.702 m), weight 227 lb 9.6 oz (103.2 kg), SpO2 99 %.  Examination: General Appearance: The patient is well-developed, well-nourished, and in no distress. Skin: Gross inspection of skin  unremarkable. Head: normocephalic, no gross deformities. Eyes: no gross deformities noted. ENT: ears appear grossly normal no exudates. Neck: Supple. No thyromegaly. No LAD. Respiratory: Lungs clear to auscultation bilaterally. Cardiovascular: Normal S1 and S2 without murmur or rub. Extremities: No cyanosis. pulses are equal. Neurologic: Alert and oriented. No involuntary movements.  LABS: No results found for this or any previous visit (from the past 2160 hour(s)).  Radiology: No results found.  No results found.  No results found.    Assessment and Plan: Patient Active Problem List   Diagnosis Date Noted   Type 2 diabetes mellitus, controlled (HCC) 10/13/2015   Bilateral inguinal hernia 03/08/2014   MRSA (methicillin resistant Staphylococcus aureus) infection 09/02/2013   Dyslipidemia 12/14/2012    1. Obstructive sleep apnea Will continue cpap nightly and have upcoming download as scheduled. Will re-order new APAP - For home use only DME continuous positive airway pressure (CPAP)  2. CPAP use counseling CPAP couseling-Discussed importance of adequate CPAP use as well as proper care and cleaning techniques of machine and all supplies.  3. Essential hypertension Mildly elevated in office, Continue current medication and f/u with PCP.  4. Obesity (BMI 30-39.9) Obesity Counseling: Had a lengthy discussion regarding patients BMI and weight issues. Patient was instructed on portion control as well as increased activity. Also discussed caloric restrictions with trying to maintain intake less than 2000 Kcal. Discussions were made in accordance with the 5As of weight management. Simple actions such as not eating late and if able to, taking a walk is suggested.    General Counseling: I have discussed the findings of the evaluation and examination with 12/16/2012.  I have also discussed any further diagnostic evaluation thatmay be needed or ordered today. Sylvanus verbalizes  understanding of the findings of todays visit. We also reviewed his medications today and discussed drug interactions and side effects including but not limited excessive drowsiness and altered mental states. We also discussed that there is always a risk not just to him but also people around him. he has been encouraged to call the office with any questions or concerns that should arise related to todays visit.  Orders Placed This Encounter  Procedures   For home use only DME continuous positive airway pressure (CPAP)    [facility], needs new APAP    Order Specific Question:   Length of Need    Answer:   Lifetime    Order Specific Question:   Patient has OSA or probable OSA    Answer:   Yes    Order Specific Question:   Is the patient currently using CPAP in the home    Answer:   Yes    Order Specific Question:   Settings    Answer:   Other see comments    Order Specific Question:   CPAP supplies needed    Answer:   Mask, headgear, cushions, filters, heated tubing and water chamber     Time  spent: 30  I have personally obtained a history, examined the patient, evaluated laboratory and imaging results, formulated the assessment and plan and placed orders. This patient was seen by Lynn Ito, PA-C in collaboration with Dr. Freda Munro as a part of collaborative care agreement.     Yevonne Pax, MD [facility] Pulmonary and Critical Care Sleep medicine

## 2021-09-05 ENCOUNTER — Ambulatory Visit: Payer: Medicare (Managed Care)

## 2022-02-25 ENCOUNTER — Encounter: Payer: Self-pay | Admitting: Physician Assistant

## 2022-02-25 ENCOUNTER — Ambulatory Visit (INDEPENDENT_AMBULATORY_CARE_PROVIDER_SITE_OTHER): Payer: Medicare (Managed Care) | Admitting: Physician Assistant

## 2022-02-25 VITALS — BP 132/76 | HR 54 | Temp 97.8°F | Resp 16 | Ht 67.0 in | Wt 228.0 lb

## 2022-02-25 DIAGNOSIS — I10 Essential (primary) hypertension: Secondary | ICD-10-CM

## 2022-02-25 DIAGNOSIS — E669 Obesity, unspecified: Secondary | ICD-10-CM

## 2022-02-25 DIAGNOSIS — Z7189 Other specified counseling: Secondary | ICD-10-CM | POA: Diagnosis not present

## 2022-02-25 DIAGNOSIS — G4733 Obstructive sleep apnea (adult) (pediatric): Secondary | ICD-10-CM | POA: Diagnosis not present

## 2022-02-25 NOTE — Progress Notes (Signed)
[facility] [place], [place] [address]  Pulmonary Sleep Medicine   Office Visit Note  Patient Name: Troy MONETTE Sr. DOB: [birth year] MRN [id number]  Date of Service: 02/28/2022  Complaints/HPI: Pt is here for routine follow up for OSA on CPAP. Thinks he is on APAP 6-16. Never got new machine though order has been placed twice now. He uses [facility] for supplies. Feels well rested after using machine. 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 He denies SOB, dryness, or headaches and is benefiting from use.  Data from machine gathered today: Days used 29/30 Days 4+ hours 27/30 Avg usage 6.9 hours Avg pressure 15.4 Leak 18L/min AHI 2.1  ROS  General: (-) fever, (-) chills, (-) night sweats, (-) weakness Skin: (-) rashes, (-) itching,. Eyes: (-) visual changes, (-) redness, (-) itching. Nose and Sinuses: (-) nasal stuffiness or itchiness, (-) postnasal drip, (-) nosebleeds, (-) sinus trouble. Mouth and Throat: (-) sore throat, (-) hoarseness. Neck: (-) swollen glands, (-) enlarged thyroid, (-) neck pain. Respiratory: - cough, (-) bloody sputum, - shortness of breath, - wheezing. Cardiovascular: - ankle swelling, (-) chest pain. Lymphatic: (-) lymph node enlargement. Neurologic: (-) numbness, (-) tingling. Psychiatric: (-) anxiety, (-) depression   Current Medication: Outpatient Encounter Medications as of 02/25/2022  Medication Sig Note   aspirin EC 81 MG tablet Take 81 mg by mouth daily.    diltiazem (CARDIZEM LA) 360 MG 24 hr tablet Take by mouth. 10/13/2015: Received from: [place]: Take 360 mg by mouth.   lisinopril-hydrochlorothiazide (ZESTORETIC) 20-12.5 MG tablet Take 1 tablet by mouth daily.    nitroGLYCERIN (NITROSTAT) 0.4 MG SL tablet PLACE ONE TABLET UNDER THE TONGUE AS NEEDED FOR CHEST PAIN.(MAX IS 3 DOSES, IF NO RELIEF CALL 911).    omeprazole (PRILOSEC) 20 MG capsule Take 20 mg by mouth daily. 06/26/2013:  Received Sig:    tamsulosin (FLOMAX) 0.4 MG CAPS capsule Take 0.4 mg by mouth daily. 06/26/2013: Received Sig:    No facility-administered encounter medications on file as of 02/25/2022.    Surgical History: Past Surgical History:  Procedure Laterality Date   HERNIA REPAIR      Medical History: Past Medical History:  Diagnosis Date   Diabetes ([place])    Hypertension    Sleep apnea     Family History: Family History  Problem Relation Age of Onset   Cancer Mother        breast    Congestive Heart Failure Father    Diabetes Sister     Social History: Social History   Socioeconomic History   Marital status: Widowed    Spouse name: Not on file   Number of children: Not on file   Years of education: Not on file   Highest education level: Not on file  Occupational History   Not on file  Tobacco Use   Smoking status: Former    Types: Cigarettes   Smokeless tobacco: Never  Vaping Use   Vaping Use: Never used  Substance and Sexual Activity   Alcohol use: No   Drug use: No   Sexual activity: Not on file  Other Topics Concern   Not on file  Social History Narrative   Not on file   Social Determinants of Health   Financial Resource Strain: Not on file  Food Insecurity: Not on file  Transportation Needs: Not on file  Physical Activity: Not on file  Stress: Not on file  Social Connections: Not  on file  Intimate Partner Violence: Not on file    Vital Signs: Blood pressure 132/76, pulse (!) 54, temperature 97.8 F (36.6 C), resp. rate 16, height 5\' 7"  (1.702 m), weight 228 lb (103.4 kg), SpO2 98 %.  Examination: General Appearance: The patient is well-developed, well-nourished, and in no distress. Skin: Gross inspection of skin unremarkable. Head: normocephalic, no gross deformities. Eyes: no gross deformities noted. ENT: ears appear grossly normal no exudates. Neck: Supple. No thyromegaly. No LAD. Respiratory: Lungs clear to auscultation  bilaterally. Cardiovascular: Normal S1 and S2 without murmur or rub. Extremities: No cyanosis. pulses are equal. Neurologic: Alert and oriented. No involuntary movements.  LABS: No results found for this or any previous visit (from the past 2160 hour(s)).  Radiology: No results found.  No results found.  No results found.    Assessment and Plan: Patient Active Problem List   Diagnosis Date Noted   Type 2 diabetes mellitus, controlled ([place]) 10/13/2015   Bilateral inguinal hernia 03/08/2014   MRSA (methicillin resistant Staphylococcus aureus) infection 09/02/2013   Dyslipidemia 12/14/2012    1. Obstructive sleep apnea Continue excellent compliance. Will contact DME company about status of new machine as his current machine is past end of life and needs to be replaced  2. CPAP use counseling CPAP couseling-Discussed importance of adequate CPAP use as well as proper care and cleaning techniques of machine and all supplies.  3. Essential hypertension Continue current medication and f/u with PCP.  4. Obesity (BMI 30-39.9) Obesity Counseling: Had a lengthy discussion regarding patients BMI and weight issues. Patient was instructed on portion control as well as increased activity. Also discussed caloric restrictions with trying to maintain intake less than 2000 Kcal. Discussions were made in accordance with the 5As of weight management. Simple actions such as not eating late and if able to, taking a walk is suggested.    General Counseling: I have discussed the findings of the evaluation and examination with Troy Cooper.  I have also discussed any further diagnostic evaluation thatmay be needed or ordered today. Troy Cooper verbalizes understanding of the findings of todays visit. We also reviewed his medications today and discussed drug interactions and side effects including but not limited excessive drowsiness and altered mental states. We also discussed that there is always a risk not just  to him but also people around him. he has been encouraged to call the office with any questions or concerns that should arise related to todays visit.  No orders of the defined types were placed in this encounter.    Time spent: 30  I have personally obtained a history, examined the patient, evaluated laboratory and imaging results, formulated the assessment and plan and placed orders. This patient was seen by Drema Dallas, PA-C in collaboration with Dr. Devona Konig as a part of collaborative care agreement.     Allyne Gee, MD [facility] Pulmonary and Critical Care Sleep medicine

## 2022-02-28 ENCOUNTER — Telehealth: Payer: Self-pay

## 2022-02-28 NOTE — Patient Instructions (Signed)

## 2022-02-28 NOTE — Telephone Encounter (Signed)
Send message and spoke ashley for APAP order  and also advised him to call me know when its done

## 2022-03-18 ENCOUNTER — Telehealth: Payer: Self-pay

## 2022-03-18 NOTE — Telephone Encounter (Signed)
Lmom to pt that he received call from [facility] for APAP setup

## 2022-04-02 ENCOUNTER — Telehealth: Payer: Self-pay | Admitting: Family Medicine

## 2022-04-02 ENCOUNTER — Telehealth: Payer: Self-pay | Admitting: Physician Assistant

## 2022-04-02 NOTE — Telephone Encounter (Signed)
Received [facility] request order. Gave to Ehrhardt for signature

## 2022-04-03 NOTE — Telephone Encounter (Signed)
error 

## 2022-04-09 ENCOUNTER — Telehealth: Payer: Self-pay | Admitting: Physician Assistant

## 2022-04-09 NOTE — Telephone Encounter (Addendum)
[facility] order signed. Office visit notes of 22 pages Faxed back; [phone]. To be scanned

## 2022-08-26 ENCOUNTER — Ambulatory Visit: Payer: Medicare (Managed Care) | Admitting: Physician Assistant

## 2022-08-29 ENCOUNTER — Ambulatory Visit: Payer: Medicare (Managed Care) | Admitting: Physician Assistant

## 2022-08-29 ENCOUNTER — Encounter: Payer: Self-pay | Admitting: Physician Assistant

## 2022-08-29 VITALS — BP 132/62 | HR 67 | Temp 97.8°F | Resp 16 | Ht 67.0 in | Wt 229.0 lb

## 2022-08-29 DIAGNOSIS — I10 Essential (primary) hypertension: Secondary | ICD-10-CM | POA: Diagnosis not present

## 2022-08-29 DIAGNOSIS — Z7189 Other specified counseling: Secondary | ICD-10-CM | POA: Diagnosis not present

## 2022-08-29 DIAGNOSIS — G4733 Obstructive sleep apnea (adult) (pediatric): Secondary | ICD-10-CM | POA: Diagnosis not present

## 2022-08-29 DIAGNOSIS — E669 Obesity, unspecified: Secondary | ICD-10-CM

## 2022-08-29 NOTE — Progress Notes (Signed)
[facility] [address] [place], [place] [address]  Pulmonary Sleep Medicine   Office Visit Note  Patient Name: Troy WARFORD Sr. DOB: Mar 15, 1950 MRN [id number]  Date of Service: 08/29/2022  Complaints/HPI: Pt is here for routine follow up on CPAP. Wearing new machine now. Wearing nightly and benefiting from use. Denies SOB, headaches or dryness. Uses [facility] for his supplies and is changing regularly and keeping clean. Unfortunately no download to review in office. Patient is in a boot in office due to breaking 5th metatarsal on left foot, followed by ortho.  ROS  General: (-) fever, (-) chills, (-) night sweats, (-) weakness Skin: (-) rashes, (-) itching,. Eyes: (-) visual changes, (-) redness, (-) itching. Nose and Sinuses: (-) nasal stuffiness or itchiness, (-) postnasal drip, (-) nosebleeds, (-) sinus trouble. Mouth and Throat: (-) sore throat, (-) hoarseness. Neck: (-) swollen glands, (-) enlarged thyroid, (-) neck pain. Respiratory: - cough, (-) bloody sputum, - shortness of breath, - wheezing. Cardiovascular: - ankle swelling, (-) chest pain. Lymphatic: (-) lymph node enlargement. Neurologic: (-) numbness, (-) tingling. Psychiatric: (-) anxiety, (-) depression   Current Medication: Outpatient Encounter Medications as of 08/29/2022  Medication Sig Note   aspirin EC 81 MG tablet Take 81 mg by mouth daily.    diltiazem (CARDIZEM LA) 360 MG 24 hr tablet Take by mouth. 10/13/2015: Received from: [facility] Received Sig: Take 360 mg by mouth.   lisinopril-hydrochlorothiazide (ZESTORETIC) 20-12.5 MG tablet Take 1 tablet by mouth daily.    nitroGLYCERIN (NITROSTAT) 0.4 MG SL tablet PLACE ONE TABLET UNDER THE TONGUE AS NEEDED FOR CHEST PAIN.(MAX IS 3 DOSES, IF NO RELIEF CALL 911).    omeprazole (PRILOSEC) 20 MG capsule Take 20 mg by mouth daily. 06/26/2013: Received Sig:    tamsulosin (FLOMAX) 0.4 MG CAPS capsule Take 0.4 mg by mouth daily. 06/26/2013:  Received Sig:    No facility-administered encounter medications on file as of 08/29/2022.    Surgical History: Past Surgical History:  Procedure Laterality Date   HERNIA REPAIR      Medical History: Past Medical History:  Diagnosis Date   Diabetes ([facility])    Hypertension    Sleep apnea     Family History: Family History  Problem Relation Age of Onset   Cancer Mother        breast    Congestive Heart Failure Father    Diabetes Sister     Social History: Social History   Socioeconomic History   Marital status: Widowed    Spouse name: Not on file   Number of children: Not on file   Years of education: Not on file   Highest education level: Not on file  Occupational History   Not on file  Tobacco Use   Smoking status: Former    Types: Cigarettes   Smokeless tobacco: Never  Vaping Use   Vaping status: Never Used  Substance and Sexual Activity   Alcohol use: No   Drug use: No   Sexual activity: Not on file  Other Topics Concern   Not on file  Social History Narrative   Not on file   Social Determinants of Health   Financial Resource Strain: Low Risk  (02/05/2022)   Received from [facility], [facility]   Overall Financial Resource Strain (CARDIA)    Difficulty of Paying Living Expenses: Not hard at all  Food Insecurity: No Food Insecurity (02/05/2022)   Received from [facility], [facility]   Hunger Vital Sign    Worried About Running Out of Food in the Last Year: Never true    Ran Out of Food in the Last Year: Never true  Transportation Needs: No Transportation Needs (02/05/2022)   Received from [facility], [facility]   [facility] - Transportation    In the past 12 months, has lack of transportation kept you from medical appointments or from getting medications?: No    Lack of Transportation (Non-Medical): No  Physical Activity: Not on file  Stress:  Not on file  Social Connections: Not on file  Intimate Partner Violence: Not on file    Vital Signs: Blood pressure 132/62, pulse 67, temperature 97.8 F (36.6 C), resp. rate 16, height 5\' 7"  (1.702 m), weight 229 lb (103.9 kg), SpO2 98%.  Examination: General Appearance: The patient is well-developed, well-nourished, and in no distress. Skin: Gross inspection of skin unremarkable. Head: normocephalic, no gross deformities. Eyes: no gross deformities noted. ENT: ears appear grossly normal no exudates. Neck: Supple. No thyromegaly. No LAD. Respiratory: Lungs clear to auscultation. Cardiovascular: Normal S1 and S2 without murmur or rub. Extremities: No cyanosis. pulses are equal. Neurologic: Alert and oriented. No involuntary movements.  LABS: No results found for this or any previous visit (from the past 2160 hour(s)).  Radiology: No results found.  No results found.  No results found.    Assessment and Plan: Patient Active Problem List   Diagnosis Date Noted   Type 2 diabetes mellitus, controlled ([facility]) 10/13/2015   Bilateral inguinal hernia 03/08/2014   MRSA (methicillin resistant Staphylococcus aureus) infection 09/02/2013   Dyslipidemia 12/14/2012    1. Obstructive sleep apnea Continue nightly cpap use  2. CPAP use counseling CPAP couseling-Discussed importance of adequate CPAP use as well as proper care and cleaning techniques of machine and all supplies.  3. Essential hypertension Continue current medication and f/u with PCP.  4. Obesity (BMI 30-39.9) Obesity Counseling: Had a lengthy discussion regarding patients BMI and weight issues. Patient was instructed on portion control as well as increased activity. Also discussed caloric restrictions with trying to maintain intake less than 2000 Kcal. Discussions were made in accordance with the 5As of weight management. Simple actions such as not eating late and if able to, taking a walk is suggested.    General  Counseling: I have discussed the findings of the evaluation and examination with Troy Cooper.  I have also discussed any further diagnostic evaluation thatmay be needed or ordered today. Troy Cooper verbalizes understanding of the findings of todays visit. We also reviewed his medications today and discussed drug interactions and side effects including but not limited excessive drowsiness and altered mental states. We also discussed that there is always a risk not just to him but also people around him. he has been encouraged to call the office with any questions or concerns that should arise related to todays visit.  No orders of the defined types were placed in this encounter.    Time spent: 30  I have personally obtained a history, examined the patient, evaluated laboratory and imaging results, formulated the assessment and plan and placed orders. This patient was seen by Lynn Ito, PA-C in collaboration with Dr. Freda Munro as a part of collaborative care agreement.     Yevonne Pax, MD [facility] Pulmonary and Critical Care Sleep medicine

## 2023-03-12 ENCOUNTER — Ambulatory Visit: Payer: Medicare (Managed Care)

## 2023-03-20 ENCOUNTER — Ambulatory Visit: Payer: Medicare (Managed Care) | Admitting: Physician Assistant
